# Patient Record
Sex: Female | Born: 1990
Health system: Southern US, Community
[De-identification: ages and names within clinical notes are randomized; demographics above are authoritative.]

## PROBLEM LIST (undated history)

## (undated) DIAGNOSIS — Z91148 Patient's other noncompliance with medication regimen for other reason: Secondary | ICD-10-CM

## (undated) DIAGNOSIS — M419 Scoliosis, unspecified: Secondary | ICD-10-CM

## (undated) DIAGNOSIS — G8929 Other chronic pain: Secondary | ICD-10-CM

## (undated) DIAGNOSIS — E119 Type 2 diabetes mellitus without complications: Secondary | ICD-10-CM

## (undated) DIAGNOSIS — M62838 Other muscle spasm: Secondary | ICD-10-CM

## (undated) DIAGNOSIS — B373 Candidiasis of vulva and vagina: Secondary | ICD-10-CM

## (undated) DIAGNOSIS — Z9114 Patient's other noncompliance with medication regimen: Secondary | ICD-10-CM

## (undated) DIAGNOSIS — B3731 Acute candidiasis of vulva and vagina: Secondary | ICD-10-CM

## (undated) DIAGNOSIS — M546 Pain in thoracic spine: Secondary | ICD-10-CM

## (undated) DIAGNOSIS — G629 Polyneuropathy, unspecified: Secondary | ICD-10-CM

## (undated) HISTORY — PX: OTHER SURGICAL HISTORY: SHX169

---

## 2013-05-26 ENCOUNTER — Encounter (HOSPITAL_BASED_OUTPATIENT_CLINIC_OR_DEPARTMENT_OTHER): Payer: Self-pay | Admitting: Emergency Medicine

## 2013-05-26 DIAGNOSIS — H53149 Visual discomfort, unspecified: Secondary | ICD-10-CM | POA: Insufficient documentation

## 2013-05-26 DIAGNOSIS — R51 Headache: Secondary | ICD-10-CM | POA: Insufficient documentation

## 2013-05-26 DIAGNOSIS — R110 Nausea: Secondary | ICD-10-CM | POA: Insufficient documentation

## 2013-05-26 DIAGNOSIS — R42 Dizziness and giddiness: Secondary | ICD-10-CM | POA: Insufficient documentation

## 2013-05-26 DIAGNOSIS — Z3202 Encounter for pregnancy test, result negative: Secondary | ICD-10-CM | POA: Insufficient documentation

## 2013-05-26 NOTE — ED Provider Notes (Signed)
CSN: [id number]     Arrival date & time 05/26/13  2140 History  This chart was scribed for [name], MD by Leone Payor, ED Scribe. This patient was seen in room MH09/MH09 and the patient's care was started 12:25 AM.    Chief Complaint  Patient presents with  . Headache    Patient is a 23 y.o. female presenting with headaches. The history is provided by the patient. No language interpreter was used.  Headache Pain location:  Frontal Quality:  Dull Radiates to:  Does not radiate Onset quality:  Gradual Timing:  Constant Progression:  Unchanged Chronicity:  Recurrent Similar to prior headaches: yes   Context: not activity, not exposure to bright light and not straining   Relieved by:  Nothing Worsened by:  Nothing tried Ineffective treatments:  None tried Associated symptoms: nausea   Associated symptoms: no abdominal pain, no diarrhea, no ear pain, no pain, no focal weakness, no neck pain, no paresthesias, no sinus pressure, no sore throat, no vomiting and no weakness   Risk factors: no anger    HPI Comments: Elaine Vasquez is a 23 y.o. female who presents to the Emergency Department complaining of a constant, gradually worsened left sided HA that began 9 hours ago. She also reports having associated nausea, dizziness, photophobia. She denies taking any OTC medications because she may be pregnant. She has not followed up with an OB about this. LNMP was Dec 5-8. She having a light period that started later than normal for this month.   History reviewed. No pertinent past medical history. History reviewed. No pertinent past surgical history. History reviewed. No pertinent family history. History  Substance Use Topics  . Smoking status: Never Smoker   . Smokeless tobacco: Never Used  . Alcohol Use: No   OB History   Grav Para Term Preterm Abortions TAB SAB Ect Mult Living   1 1             Review of Systems  HENT: Negative for ear pain, sinus pressure and sore  throat.   Eyes: Negative for pain.  Gastrointestinal: Positive for nausea. Negative for vomiting, abdominal pain and diarrhea.  Musculoskeletal: Negative for neck pain.  Neurological: Positive for headaches. Negative for focal weakness and paresthesias.  All other systems reviewed and are negative.    Allergies  Review of patient's allergies indicates no known allergies.  Home Medications  No current outpatient prescriptions on file. BP 122/63  Pulse 75  Temp(Src) 98.4 F (36.9 C) (Oral)  Resp 16  SpO2 100%  LMP 05/21/2013 Physical Exam  Nursing note and vitals reviewed. Constitutional: She is oriented to person, place, and time. She appears well-developed and well-nourished.  HENT:  Head: Normocephalic and atraumatic.  Mouth/Throat: Oropharynx is clear and moist.  Eyes: Conjunctivae and EOM are normal. Pupils are equal, round, and reactive to light.  Neck: Normal range of motion. Neck supple.  Cardiovascular: Normal rate, regular rhythm, normal heart sounds and intact distal pulses.   Pulmonary/Chest: Effort normal and breath sounds normal. No respiratory distress. She has no wheezes. She has no rales.  Abdominal: Soft. She exhibits no distension. There is no tenderness. There is no rebound and no guarding.  Musculoskeletal: Normal range of motion.  Neurological: She is alert and oriented to person, place, and time. She has normal reflexes. No cranial nerve deficit.  Skin: Skin is warm and dry.  Psychiatric: She has a normal mood and affect.    ED Course  Procedures (  including critical care time)  DIAGNOSTIC STUDIES: Oxygen Saturation is 100% on RA, normal by my interpretation.    COORDINATION OF CARE: 12:28 AM Discussed treatment plan with pt at bedside and pt agreed to plan.   Labs Review Labs Reviewed  PREGNANCY, URINE   Imaging Review No results found.  EKG Interpretation   None       MDM  No diagnosis found. Follow up with your family doctor for  ongoing care  I personally performed the services described in this documentation, which was scribed in my presence. The recorded information has been reviewed and is accurate.    Jasmine AweApril K Perley Arthurs-Rasch, MD 05/27/13 [phone]

## 2013-05-26 NOTE — ED Notes (Signed)
Pt reports headache onset at 15:00 today that gradually worsened to photophobeia and nausea, denies emesis. Denies taking OTC meds d/t fear of pregnancy

## 2013-05-27 ENCOUNTER — Emergency Department (HOSPITAL_BASED_OUTPATIENT_CLINIC_OR_DEPARTMENT_OTHER)
Admission: EM | Admit: 2013-05-27 | Discharge: 2013-05-27 | Disposition: A | Discharge status: Home / Self Care | Payer: Medicaid Other | Attending: Emergency Medicine | Admitting: Emergency Medicine

## 2013-05-27 DIAGNOSIS — R51 Headache: Secondary | ICD-10-CM

## 2013-05-27 DIAGNOSIS — R519 Headache, unspecified: Secondary | ICD-10-CM

## 2013-05-27 LAB — PREGNANCY, URINE: PREG TEST UR: NEGATIVE

## 2013-05-27 MED ORDER — ACETAMINOPHEN 500 MG PO TABS
1000.0000 mg | ORAL_TABLET | Freq: Once | ORAL | Status: AC
  Administered 2013-05-27: 1000 mg via ORAL
  Filled 2013-05-27: qty 2

## 2013-05-27 NOTE — ED Notes (Signed)
Pt given d/c instructions States headache is completely relieved

## 2014-03-18 ENCOUNTER — Encounter (HOSPITAL_BASED_OUTPATIENT_CLINIC_OR_DEPARTMENT_OTHER): Payer: Self-pay | Admitting: Emergency Medicine

## 2015-08-20 ENCOUNTER — Emergency Department (HOSPITAL_BASED_OUTPATIENT_CLINIC_OR_DEPARTMENT_OTHER)
Admission: EM | Admit: 2015-08-20 | Discharge: 2015-08-20 | Disposition: A | Discharge status: Home / Self Care | Payer: Medicaid Other | Attending: Emergency Medicine | Admitting: Emergency Medicine

## 2015-08-20 ENCOUNTER — Encounter (HOSPITAL_BASED_OUTPATIENT_CLINIC_OR_DEPARTMENT_OTHER): Payer: Self-pay

## 2015-08-20 DIAGNOSIS — J069 Acute upper respiratory infection, unspecified: Secondary | ICD-10-CM | POA: Diagnosis not present

## 2015-08-20 DIAGNOSIS — J302 Other seasonal allergic rhinitis: Secondary | ICD-10-CM | POA: Insufficient documentation

## 2015-08-20 DIAGNOSIS — Z8739 Personal history of other diseases of the musculoskeletal system and connective tissue: Secondary | ICD-10-CM | POA: Diagnosis not present

## 2015-08-20 DIAGNOSIS — E119 Type 2 diabetes mellitus without complications: Secondary | ICD-10-CM | POA: Diagnosis not present

## 2015-08-20 DIAGNOSIS — R05 Cough: Secondary | ICD-10-CM | POA: Diagnosis present

## 2015-08-20 DIAGNOSIS — Z7984 Long term (current) use of oral hypoglycemic drugs: Secondary | ICD-10-CM | POA: Diagnosis not present

## 2015-08-20 HISTORY — DX: Type 2 diabetes mellitus without complications: E11.9

## 2015-08-20 HISTORY — DX: Scoliosis, unspecified: M41.9

## 2015-08-20 HISTORY — DX: Other muscle spasm: M62.838

## 2015-08-20 LAB — CBG MONITORING, ED: GLUCOSE-CAPILLARY: 115 mg/dL — AB (ref 65–99)

## 2015-08-20 LAB — RAPID STREP SCREEN (MED CTR MEBANE ONLY): Streptococcus, Group A Screen (Direct): NEGATIVE

## 2015-08-20 MED ORDER — LORATADINE 10 MG PO TABS: 10.0000 mg | ORAL_TABLET | Freq: Every day | ORAL | Status: DC

## 2015-08-20 MED ORDER — FLUTICASONE PROPIONATE 50 MCG/ACT NA SUSP: 2.0000 | Freq: Every day | NASAL | Status: DC

## 2015-08-20 MED ORDER — IBUPROFEN 600 MG PO TABS: 600.0000 mg | ORAL_TABLET | Freq: Four times a day (QID) | ORAL | Status: DC | PRN

## 2015-08-20 NOTE — ED Notes (Signed)
Pt c/o sneezing, coughing, running nose, headache and body aches since 1p yesterday

## 2015-08-20 NOTE — Discharge Instructions (Signed)
Allergic Rhinitis Allergic rhinitis is when the mucous membranes in the nose respond to allergens. Allergens are particles in the air that cause your body to have an allergic reaction. This causes you to release allergic antibodies. Through a chain of events, these eventually cause you to release histamine into the blood stream. Although meant to protect the body, it is this release of histamine that causes your discomfort, such as frequent sneezing, congestion, and an itchy, runny nose.  CAUSES Seasonal allergic rhinitis (hay fever) is caused by pollen allergens that may come from grasses, trees, and weeds. Year-round allergic rhinitis (perennial allergic rhinitis) is caused by allergens such as house dust mites, pet dander, and mold spores. SYMPTOMS  Nasal stuffiness (congestion).  Itchy, runny nose with sneezing and tearing of the eyes. DIAGNOSIS Your health care provider can help you determine the allergen or allergens that trigger your symptoms. If you and your health care provider are unable to determine the allergen, skin or blood testing may be used. Your health care provider will diagnose your condition after taking your health history and performing a physical exam. Your health care provider may assess you for other related conditions, such as asthma, pink eye, or an ear infection. TREATMENT Allergic rhinitis does not have a cure, but it can be controlled by:  Medicines that block allergy symptoms. These may include allergy shots, nasal sprays, and oral antihistamines.  Avoiding the allergen. Hay fever may often be treated with antihistamines in pill or nasal spray forms. Antihistamines block the effects of histamine. There are over-the-counter medicines that may help with nasal congestion and swelling around the eyes. Check with your health care provider before taking or giving this medicine. If avoiding the allergen or the medicine prescribed do not work, there are many new medicines  your health care provider can prescribe. Stronger medicine may be used if initial measures are ineffective. Desensitizing injections can be used if medicine and avoidance does not work. Desensitization is when a patient is given ongoing shots until the body becomes less sensitive to the allergen. Make sure you follow up with your health care provider if problems continue. HOME CARE INSTRUCTIONS It is not possible to completely avoid allergens, but you can reduce your symptoms by taking steps to limit your exposure to them. It helps to know exactly what you are allergic to so that you can avoid your specific triggers. SEEK MEDICAL CARE IF:  You have a fever.  You develop a cough that does not stop easily (persistent).  You have shortness of breath.  You start wheezing.  Symptoms interfere with normal daily activities.   This information is not intended to replace advice given to you by your health care provider. Make sure you discuss any questions you have with your health care provider.   Document Released: 01/26/2001 Document Revised: 05/24/2014 Document Reviewed: 01/08/2013 Elsevier Interactive Patient Education 2016 [employer]  

## 2015-08-20 NOTE — ED Provider Notes (Signed)
CSN: [id number]     Arrival date & time 08/20/15  0127 History   First MD Initiated Contact with Patient 08/20/15 0154     Chief Complaint  Patient presents with  . URI     (Consider location/radiation/quality/duration/timing/severity/associated sxs/prior Treatment) HPI  This is a 25 year old female with a history of diabetes who presents with upper respiratory symptoms. Onset of symptoms this afternoon. Patient reports sneezing, coughing, rhinorrhea, and myalgias. She also reports watery eyes. She has not taken anything for her symptoms. No known fevers but does report chills. Reports a sick contact at work. Denies any vomiting or diarrhea. Denies any abdominal pain. Patient is a diabetic and is supposed to be on insulin. Patient reports that she ran out of her insulin and her testing supplies. She is unsure of her recent blood sugars.  Past Medical History  Diagnosis Date  . Diabetes mellitus without complication (HCC)   . Scoliosis   . Muscle spasm    History reviewed. No pertinent past surgical history. No family history on file. Social History  Substance Use Topics  . Smoking status: Never Smoker   . Smokeless tobacco: Never Used  . Alcohol Use: Yes     Comment: social   OB History    Gravida Para Term Preterm AB TAB SAB Ectopic Multiple Living   1 1             Review of Systems  Constitutional: Negative for fever.  HENT: Positive for rhinorrhea, sneezing and sore throat.   Respiratory: Positive for cough. Negative for shortness of breath.   Cardiovascular: Negative for chest pain.  Gastrointestinal: Negative for nausea, vomiting, abdominal pain and diarrhea.  All other systems reviewed and are negative.     Allergies  Review of patient's allergies indicates no known allergies.  Home Medications   Prior to Admission medications   Medication Sig Start Date End Date Taking? Authorizing Provider  metFORMIN (GLUCOPHAGE) 1000 MG tablet Take 1,000 mg by mouth 2  (two) times daily with a meal.   Yes Historical Provider, MD  fluticasone (FLONASE) 50 MCG/ACT nasal spray Place 2 sprays into both nostrils daily. 08/20/15   Shon Batonourtney F Horton, MD  ibuprofen (ADVIL,MOTRIN) 600 MG tablet Take 1 tablet (600 mg total) by mouth every 6 (six) hours as needed. 08/20/15   Shon Batonourtney F Horton, MD  loratadine (CLARITIN) 10 MG tablet Take 1 tablet (10 mg total) by mouth daily. 08/20/15   Shon Batonourtney F Horton, MD   BP 120/65 mmHg  Pulse 94  Temp(Src) 99.1 F (37.3 C) (Oral)  Resp 16  Ht 5\' 3"  (1.6 m)  Wt 150 lb (68.04 kg)  BMI 26.58 kg/m2  SpO2 100%  LMP 08/06/2015 Physical Exam  Constitutional: She is oriented to person, place, and time. She appears well-developed and well-nourished.  HENT:  Head: Normocephalic and atraumatic.  Erythema to the posterior oropharynx, postnasal drip noted, mild tonsillar swelling, no exudate Effusions without redness or drilling of the light reflex behind bilateral TMs.  Eyes: Pupils are equal, round, and reactive to light.  Tearing noted with mild conjunctival injection  Cardiovascular: Normal rate, regular rhythm and normal heart sounds.   No murmur heard. Pulmonary/Chest: Effort normal and breath sounds normal. No respiratory distress. She has no wheezes.  Abdominal: Soft. Bowel sounds are normal. There is no tenderness. There is no rebound.  Neurological: She is alert and oriented to person, place, and time.  Skin: Skin is warm and dry.  Psychiatric: She has a  normal mood and affect.  Nursing note and vitals reviewed.   ED Course  Procedures (including critical care time) Labs Review Labs Reviewed  CBG MONITORING, ED - Abnormal; Notable for the following:    Glucose-Capillary 115 (*)    All other components within normal limits  RAPID STREP SCREEN (NOT AT [facility])  CULTURE, GROUP A STREP [facility])    Imaging Review No results found. I have personally reviewed and evaluated these images and lab results as part of my medical  decision-making.   EKG Interpretation None      MDM   Final diagnoses:  Seasonal allergies  URI (upper respiratory infection)    Patient presents with upper respiratory symptoms. Given sneezing and watery eyes, allergies are consideration. However, patient may also have an early viral illness. She is nontoxic-appearing and afebrile. Blood sugar 115. Screening strep is negative.  Discussed with patient supportive care at home including Flonase, Claritin, and ibuprofen or Tylenol for body aches.  After history, exam, and medical workup I feel the patient has been appropriately medically screened and is safe for discharge home. Pertinent diagnoses were discussed with the patient. Patient was given return precautions.     Shon Baton, MD 08/20/15 [phone]

## 2015-08-22 LAB — CULTURE, GROUP A STREP (THRC)

## 2016-01-27 ENCOUNTER — Emergency Department (HOSPITAL_BASED_OUTPATIENT_CLINIC_OR_DEPARTMENT_OTHER)
Admission: EM | Admit: 2016-01-27 | Discharge: 2016-01-28 | Disposition: A | Discharge status: Home / Self Care | Payer: Medicaid Other | Attending: Emergency Medicine | Admitting: Emergency Medicine

## 2016-01-27 ENCOUNTER — Encounter (HOSPITAL_BASED_OUTPATIENT_CLINIC_OR_DEPARTMENT_OTHER): Payer: Self-pay | Admitting: Emergency Medicine

## 2016-01-27 DIAGNOSIS — E1165 Type 2 diabetes mellitus with hyperglycemia: Secondary | ICD-10-CM | POA: Insufficient documentation

## 2016-01-27 DIAGNOSIS — R358 Other polyuria: Secondary | ICD-10-CM | POA: Diagnosis not present

## 2016-01-27 DIAGNOSIS — R109 Unspecified abdominal pain: Secondary | ICD-10-CM | POA: Insufficient documentation

## 2016-01-27 DIAGNOSIS — R632 Polyphagia: Secondary | ICD-10-CM | POA: Diagnosis not present

## 2016-01-27 DIAGNOSIS — R739 Hyperglycemia, unspecified: Secondary | ICD-10-CM

## 2016-01-27 LAB — PREGNANCY, URINE: PREG TEST UR: NEGATIVE

## 2016-01-27 LAB — CBC WITH DIFFERENTIAL/PLATELET
BASOS PCT: 0 %
Basophils Absolute: 0 10*3/uL (ref 0.0–0.1)
EOS ABS: 0.1 10*3/uL (ref 0.0–0.7)
EOS PCT: 2 %
HCT: 39.7 % (ref 36.0–46.0)
Hemoglobin: 13.9 g/dL (ref 12.0–15.0)
Lymphocytes Relative: 39 %
Lymphs Abs: 2.9 10*3/uL (ref 0.7–4.0)
MCH: 30.4 pg (ref 26.0–34.0)
MCHC: 35 g/dL (ref 30.0–36.0)
MCV: 86.9 fL (ref 78.0–100.0)
MONO ABS: 0.6 10*3/uL (ref 0.1–1.0)
MONOS PCT: 8 %
NEUTROS ABS: 3.9 10*3/uL (ref 1.7–7.7)
Neutrophils Relative %: 51 %
Platelets: 236 10*3/uL (ref 150–400)
RBC: 4.57 MIL/uL (ref 3.87–5.11)
RDW: 12.3 % (ref 11.5–15.5)
WBC: 7.5 10*3/uL (ref 4.0–10.5)

## 2016-01-27 LAB — URINALYSIS, ROUTINE W REFLEX MICROSCOPIC
BILIRUBIN URINE: NEGATIVE
Glucose, UA: >1000 mg/dL — AB
HGB URINE DIPSTICK: NEGATIVE
KETONES UR: NEGATIVE mg/dL
LEUKOCYTES UA: NEGATIVE
Nitrite: NEGATIVE
PH: 6 (ref 5.0–8.0)
PROTEIN: NEGATIVE mg/dL
Specific Gravity, Urine: 1.041 — ABNORMAL HIGH (ref 1.005–1.030)

## 2016-01-27 LAB — URINE MICROSCOPIC-ADD ON
RBC / HPF: NONE SEEN RBC/hpf (ref 0–5)
WBC, UA: NONE SEEN WBC/hpf (ref 0–5)

## 2016-01-27 LAB — I-STAT VENOUS BLOOD GAS, ED
ACID-BASE EXCESS: 1 mmol/L (ref 0.0–2.0)
BICARBONATE: 27.5 mmol/L (ref 20.0–28.0)
O2 SAT: 30 %
PO2 VEN: 20 mmHg — AB (ref 32.0–45.0)
TCO2: 29 mmol/L (ref 0–100)
pCO2, Ven: 48.4 mmHg (ref 44.0–60.0)
pH, Ven: 7.362 (ref 7.250–7.430)

## 2016-01-27 LAB — CBG MONITORING, ED

## 2016-01-27 MED ORDER — SODIUM CHLORIDE 0.9 % IV BOLUS (SEPSIS)
1000.0000 mL | Freq: Once | INTRAVENOUS | Status: AC
  Administered 2016-01-28: 1000 mL via INTRAVENOUS

## 2016-01-27 MED ORDER — SODIUM CHLORIDE 0.9 % IV BOLUS (SEPSIS)
1000.0000 mL | Freq: Once | INTRAVENOUS | Status: AC
  Administered 2016-01-27: 1000 mL via INTRAVENOUS

## 2016-01-27 NOTE — ED Provider Notes (Signed)
[facility]   CSN: [id number] Arrival date & time: 01/27/16  2244   By signing my name below, I, Elaine Vasquez, attest that this documentation has been prepared under the direction and in the presence of non-physician practitioner, Jaynie Crumbleatyana Ty Buntrock, PA-C. Electronically Signed: Nelwyn SalisburyJoshua Vasquez, Scribe. 01/27/2016. 11:21 PM.  History   Chief Complaint Chief Complaint  Patient presents with  . Hyperglycemia   The history is provided by the patient. No language interpreter was used.    HPI Comments:  Elaine Vasquez is a 25 y.o. female with PMHx of Diabetes mellitus who presents to the Emergency Department complaining elevated blood sugar today. Pt states that when she took her blood sugar today, it was greater than 600, which brought her to the ED. Pt reports that she has been out of her metformin for for the past 2 days, and is not able to afford her insulin, although she adds that she has been taken off of her insulin multiple months ago, because her sugars were well controlled.  Pt states she let her medicaid lapse so at this time unable to see PCP to have her medications be refilled. Pt reports blood sugars in 300s over last several days. She endorses associated blurred vision, abdominal discomfort, changes in appetite, headaches, and polyuria and polydipsia. No treatment prior to coming in.   Past Medical History:  Diagnosis Date  . Diabetes mellitus without complication (HCC)   . Muscle spasm   . Scoliosis     There are no active problems to display for this patient.   History reviewed. No pertinent surgical history.  OB History    Gravida Para Term Preterm AB Living   1 1           SAB TAB Ectopic Multiple Live Births                   Home Medications    Prior to Admission medications   Medication Sig Start Date End Date Taking? Authorizing Provider  fluticasone (FLONASE) 50 MCG/ACT nasal spray Place 2 sprays into both nostrils daily. 08/20/15    Shon Batonourtney F Horton, MD  ibuprofen (ADVIL,MOTRIN) 600 MG tablet Take 1 tablet (600 mg total) by mouth every 6 (six) hours as needed. 08/20/15   Shon Batonourtney F Horton, MD  loratadine (CLARITIN) 10 MG tablet Take 1 tablet (10 mg total) by mouth daily. 08/20/15   Shon Batonourtney F Horton, MD  metFORMIN (GLUCOPHAGE) 500 MG tablet Take 2 tablets (1,000 mg total) by mouth 2 (two) times daily with a meal. 01/28/16   Jaynie Crumbleatyana Sharnice Bosler, PA-C    Family History History reviewed. No pertinent family history.  Social History Social History  Substance Use Topics  . Smoking status: Never Smoker  . Smokeless tobacco: Never Used  . Alcohol use Yes     Comment: social     Allergies   Review of patient's allergies indicates no known allergies.   Review of Systems Review of Systems  Constitutional: Positive for appetite change.       Positive for Hyperglycemia  Eyes: Positive for visual disturbance.  Respiratory: Negative for shortness of breath.   Gastrointestinal: Positive for abdominal pain. Negative for diarrhea, nausea and vomiting.  Endocrine: Positive for polyphagia and polyuria.  Genitourinary: Positive for frequency (Bladder).  Neurological: Positive for headaches.  All other systems reviewed and are negative.    Physical Exam Updated Vital Signs BP 118/65   Pulse 77   Temp 98.6 F (37 C) (Oral)  Resp 16   Ht 5\' 3"  (1.6 m)   LMP 12/31/2015   SpO2 98%   Physical Exam  Constitutional: She appears well-developed and well-nourished. No distress.  HENT:  Head: Normocephalic.  Eyes: Conjunctivae are normal.  Neck: Neck supple.  Cardiovascular: Normal rate, regular rhythm and normal heart sounds.   Pulmonary/Chest: Effort normal and breath sounds normal. No respiratory distress. She has no wheezes. She has no rales.  Abdominal: Soft. Bowel sounds are normal. She exhibits no distension. There is no tenderness. There is no rebound.  Musculoskeletal: She exhibits no edema.  Neurological: She  is alert.  Skin: Skin is warm and dry.  Psychiatric: She has a normal mood and affect. Her behavior is normal.  Nursing note and vitals reviewed.    ED Treatments / Results  DIAGNOSTIC STUDIES:  Oxygen Saturation is 100% on Ra, normal by my interpretation.    COORDINATION OF CARE:  11:36 PM Discussed treatment plan with pt at bedside which included metformin, fluids, and diet/lifestyle changes and pt agreed to plan.  Labs (all labs ordered are listed, but only abnormal results are displayed) Labs Reviewed  URINALYSIS, ROUTINE W REFLEX MICROSCOPIC (NOT AT [facility]) - Abnormal; Notable for the following:       Result Value   Specific Gravity, Urine 1.041 (*)    Glucose, UA >1000 (*)    All other components within normal limits  COMPREHENSIVE METABOLIC PANEL - Abnormal; Notable for the following:    Sodium 127 (*)    Chloride 93 (*)    Glucose, Bld 702 (*)    Calcium 8.8 (*)    AST 11 (*)    ALT 10 (*)    All other components within normal limits  URINE MICROSCOPIC-ADD ON - Abnormal; Notable for the following:    Squamous Epithelial / LPF 0-5 (*)    Bacteria, UA RARE (*)    All other components within normal limits  CBG MONITORING, ED - Abnormal; Notable for the following:    Glucose-Capillary >600 (*)    All other components within normal limits  I-STAT VENOUS BLOOD GAS, ED - Abnormal; Notable for the following:    pO2, Ven 20.0 (*)    All other components within normal limits  CBG MONITORING, ED - Abnormal; Notable for the following:    Glucose-Capillary 365 (*)    All other components within normal limits  PREGNANCY, URINE  CBC WITH DIFFERENTIAL/PLATELET  CBG MONITORING, ED    EKG  EKG Interpretation None       Radiology No results found.  Procedures Procedures (including critical care time)  Medications Ordered in ED Medications  sodium chloride 0.9 % bolus 1,000 mL (0 mLs Intravenous Stopped 01/28/16 0115)  sodium chloride 0.9 % bolus 1,000 mL (0 mLs  Intravenous Stopped 01/28/16 0014)  acetaminophen (TYLENOL) tablet 650 mg (650 mg Oral Given 01/28/16 0038)  insulin regular (NOVOLIN R,HUMULIN R) 100 units/mL injection 12 Units (12 Units Subcutaneous Given 01/28/16 0040)     Initial Impression / Assessment and Plan / ED Course  I have reviewed the triage vital signs and the nursing notes.  Pertinent labs & imaging results that were available during my care of the patient were reviewed by me and considered in my medical decision making (see chart for details).  Clinical Course  Value Comment By Time  Glucose-Capillary: (!!) >600 (Reviewed) Elaine Salisbury 09/12 2337    Patient seen and examined upon presentation. Patient with polydipsia, polyuria, some blurred vision, generalized malaise,  blood sugar today read over 600. Patient has been off of metformin for several days, it is unclear how long, because initially she said it was few days but later added she hasn't been able to see her doctor in several months. Patient is not in any distress. Her vital signs are normal. Will check labs including urinalysis, metabolic panel, CBC, will give IV fluids and insulin.   Patient an anion gap is 7. No ketones in urine. VBG unremarkable. No evidence of DKA. Patient is being hydrated, will recheck her CBG after insulin and 3 L of normal saline given. Plan to discharge home if able to lower her blood sugar and continues to well. Return precautions strictly discussed. Given prescription for metformin to take at home.  Pt signed out to Dr. Ranae Palms at shift change pending cbg recheck.   Final Clinical Impressions(s) / ED Diagnoses   Final diagnoses:  Hyperglycemia    New Prescriptions Discharge Medication List as of 01/28/2016 12:22 AM     I personally performed the services described in this documentation, which was scribed in my presence. The recorded information has been reviewed and is accurate.     Jaynie Crumble, PA-C 01/28/16 1555      Loren Racer, MD 01/29/16 [phone]

## 2016-01-27 NOTE — ED Notes (Signed)
PA at bedside.

## 2016-01-27 NOTE — ED Notes (Signed)
Blood sugar greater than 600 in triage

## 2016-01-27 NOTE — ED Notes (Signed)
PA student at bedside.

## 2016-01-27 NOTE — ED Triage Notes (Signed)
Patient reports that "i don't feel good" - patient reports that she has a a elevated Blood sugar and she has a Headache. The patient reports that she has been out of her metformin since Sunday

## 2016-01-28 LAB — COMPREHENSIVE METABOLIC PANEL
ALT: 10 U/L — ABNORMAL LOW (ref 14–54)
ANION GAP: 7 (ref 5–15)
AST: 11 U/L — ABNORMAL LOW (ref 15–41)
Albumin: 4.2 g/dL (ref 3.5–5.0)
Alkaline Phosphatase: 66 U/L (ref 38–126)
BILIRUBIN TOTAL: 0.5 mg/dL (ref 0.3–1.2)
BUN: 12 mg/dL (ref 6–20)
CO2: 27 mmol/L (ref 22–32)
Calcium: 8.8 mg/dL — ABNORMAL LOW (ref 8.9–10.3)
Chloride: 93 mmol/L — ABNORMAL LOW (ref 101–111)
Creatinine, Ser: 0.91 mg/dL (ref 0.44–1.00)
GFR calc Af Amer: >60 mL/min (ref >60)
GFR calc non Af Amer: >60 mL/min (ref >60)
Glucose, Bld: 702 mg/dL (ref 65–99)
POTASSIUM: 3.9 mmol/L (ref 3.5–5.1)
SODIUM: 127 mmol/L — AB (ref 135–145)
TOTAL PROTEIN: 7.3 g/dL (ref 6.5–8.1)

## 2016-01-28 LAB — CBG MONITORING, ED: Glucose-Capillary: 365 mg/dL — ABNORMAL HIGH (ref 65–99)

## 2016-01-28 MED ORDER — METFORMIN HCL 500 MG PO TABS: 1000.0000 mg | ORAL_TABLET | Freq: Two times a day (BID) | ORAL | 1 refills | Status: DC

## 2016-01-28 MED ORDER — INSULIN REGULAR HUMAN 100 UNIT/ML IJ SOLN
12.0000 [IU] | Freq: Once | INTRAMUSCULAR | Status: AC
  Administered 2016-01-28: 12 [IU] via SUBCUTANEOUS
  Filled 2016-01-28: qty 1

## 2016-01-28 MED ORDER — ACETAMINOPHEN 325 MG PO TABS
650.0000 mg | ORAL_TABLET | Freq: Once | ORAL | Status: AC
  Administered 2016-01-28: 650 mg via ORAL
  Filled 2016-01-28: qty 2

## 2016-01-28 NOTE — ED Notes (Signed)
 NS bolus given during computer downtime.  See downtime documentation. Infusion complete as of 0200

## 2016-01-28 NOTE — Discharge Instructions (Signed)
Continue to take metformin. Follow up closely with your doctor

## 2016-09-20 ENCOUNTER — Encounter (HOSPITAL_BASED_OUTPATIENT_CLINIC_OR_DEPARTMENT_OTHER): Payer: Self-pay | Admitting: *Deleted

## 2016-09-20 ENCOUNTER — Emergency Department (HOSPITAL_BASED_OUTPATIENT_CLINIC_OR_DEPARTMENT_OTHER)
Admission: EM | Admit: 2016-09-20 | Discharge: 2016-09-21 | Disposition: A | Discharge status: Home / Self Care | Payer: Medicaid Other | Attending: Emergency Medicine | Admitting: Emergency Medicine

## 2016-09-20 DIAGNOSIS — E1165 Type 2 diabetes mellitus with hyperglycemia: Secondary | ICD-10-CM | POA: Diagnosis not present

## 2016-09-20 DIAGNOSIS — B3731 Acute candidiasis of vulva and vagina: Secondary | ICD-10-CM

## 2016-09-20 DIAGNOSIS — N898 Other specified noninflammatory disorders of vagina: Secondary | ICD-10-CM | POA: Diagnosis present

## 2016-09-20 DIAGNOSIS — N760 Acute vaginitis: Secondary | ICD-10-CM

## 2016-09-20 DIAGNOSIS — Z794 Long term (current) use of insulin: Secondary | ICD-10-CM | POA: Diagnosis not present

## 2016-09-20 DIAGNOSIS — B373 Candidiasis of vulva and vagina: Secondary | ICD-10-CM | POA: Insufficient documentation

## 2016-09-20 DIAGNOSIS — R739 Hyperglycemia, unspecified: Secondary | ICD-10-CM

## 2016-09-20 HISTORY — DX: Candidiasis of vulva and vagina: B37.3

## 2016-09-20 HISTORY — DX: Patient's other noncompliance with medication regimen for other reason: Z91.148

## 2016-09-20 HISTORY — DX: Patient's other noncompliance with medication regimen: Z91.14

## 2016-09-20 HISTORY — DX: Other chronic pain: G89.29

## 2016-09-20 HISTORY — DX: Acute candidiasis of vulva and vagina: B37.31

## 2016-09-20 HISTORY — DX: Polyneuropathy, unspecified: G62.9

## 2016-09-20 HISTORY — DX: Pain in thoracic spine: M54.6

## 2016-09-20 LAB — CBC WITH DIFFERENTIAL/PLATELET
BASOS ABS: 0 10*3/uL (ref 0.0–0.1)
BASOS PCT: 0 %
EOS PCT: 4 %
Eosinophils Absolute: 0.4 10*3/uL (ref 0.0–0.7)
HCT: 37.9 % (ref 36.0–46.0)
Hemoglobin: 13.4 g/dL (ref 12.0–15.0)
LYMPHS PCT: 35 %
Lymphs Abs: 3.2 10*3/uL (ref 0.7–4.0)
MCH: 30.2 pg (ref 26.0–34.0)
MCHC: 35.4 g/dL (ref 30.0–36.0)
MCV: 85.6 fL (ref 78.0–100.0)
MONO ABS: 0.7 10*3/uL (ref 0.1–1.0)
Monocytes Relative: 8 %
NEUTROS ABS: 4.9 10*3/uL (ref 1.7–7.7)
Neutrophils Relative %: 53 %
PLATELETS: 305 10*3/uL (ref 150–400)
RBC: 4.43 MIL/uL (ref 3.87–5.11)
RDW: 12.3 % (ref 11.5–15.5)
WBC: 9.2 10*3/uL (ref 4.0–10.5)

## 2016-09-20 LAB — URINALYSIS, ROUTINE W REFLEX MICROSCOPIC
Bilirubin Urine: NEGATIVE
Glucose, UA: >=500 mg/dL — AB
Hgb urine dipstick: NEGATIVE
Ketones, ur: NEGATIVE mg/dL
Leukocytes, UA: NEGATIVE
NITRITE: NEGATIVE
Protein, ur: NEGATIVE mg/dL
Specific Gravity, Urine: 1.045 — ABNORMAL HIGH (ref 1.005–1.030)
pH: 6 (ref 5.0–8.0)

## 2016-09-20 LAB — WET PREP, GENITAL
CLUE CELLS WET PREP: NONE SEEN
Sperm: NONE SEEN
TRICH WET PREP: NONE SEEN

## 2016-09-20 LAB — URINALYSIS, MICROSCOPIC (REFLEX)

## 2016-09-20 LAB — PREGNANCY, URINE: PREG TEST UR: NEGATIVE

## 2016-09-20 LAB — CBG MONITORING, ED: GLUCOSE-CAPILLARY: 332 mg/dL — AB (ref 65–99)

## 2016-09-20 MED ORDER — CEFTRIAXONE SODIUM 250 MG IJ SOLR
250.0000 mg | Freq: Once | INTRAMUSCULAR | Status: AC
  Administered 2016-09-21: 250 mg via INTRAMUSCULAR
  Filled 2016-09-20: qty 250

## 2016-09-20 MED ORDER — FLUCONAZOLE 50 MG PO TABS
150.0000 mg | ORAL_TABLET | Freq: Once | ORAL | Status: AC
  Administered 2016-09-21: 150 mg via ORAL
  Filled 2016-09-20: qty 1

## 2016-09-20 MED ORDER — AZITHROMYCIN 250 MG PO TABS
1000.0000 mg | ORAL_TABLET | Freq: Once | ORAL | Status: AC
  Administered 2016-09-21: 1000 mg via ORAL
  Filled 2016-09-20: qty 4

## 2016-09-20 MED ORDER — SODIUM CHLORIDE 0.9 % IV BOLUS (SEPSIS)
1000.0000 mL | Freq: Once | INTRAVENOUS | Status: AC
  Administered 2016-09-20: 1000 mL via INTRAVENOUS

## 2016-09-20 NOTE — ED Provider Notes (Signed)
[facility] Provider Note   CSN: [id number] Arrival date & time: 09/20/16  2242  By signing my nme below, I, Elaine Vasquez, attest that this documentation has been prepared under the direction and in the presence of Pricilla LovelessGoldston, Melony Tenpas, MD. Electronically Signed: Rosana Fretana Vasquez, ED Scribe. 09/20/16. 11:21 PM.  History   Chief Complaint Chief Complaint  Patient presents with  . Vaginal Discharge  . Hyperglycemia    The history is provided by the patient. No language interpreter was used.   HPI Comments:  Elaine Vasquez is a 26 y.o. female with a PMHx of DM who presents to the Emergency Department complaining of constant moderate vaginal discharge onset 3 days ago. She went to the health department and was called and told she tested positive for trich. Pt states her blood sugar is high today and the past week, has been 400-500.She has been taking her medicines inconsitently. Pt denies vomiting, abdominal pain, vaginal bleeding, or vaginal pain.    Past Medical History:  Diagnosis Date  . Chronic midline thoracic back pain   . Diabetes mellitus without complication (HCC)   . Muscle spasm   . Noncompliance with medications   . Polyneuropathy   . Scoliosis   . Vaginal candida     There are no active problems to display for this patient.   History reviewed. No pertinent surgical history.  OB History    Gravida Para Term Preterm AB Living   1 1           SAB TAB Ectopic Multiple Live Births                   Home Medications    Prior to Admission medications   Medication Sig Start Date End Date Taking? Authorizing Provider  ibuprofen (ADVIL,MOTRIN) 600 MG tablet Take 1 tablet (600 mg total) by mouth every 6 (six) hours as needed. 08/20/15  Yes Horton, Mayer Maskerourtney F, MD  insulin aspart (NOVOLOG) 100 unit/mL injection Inject 10 Units into the skin 3 (three) times daily before meals.   Yes [provider]  insulin glargine (LANTUS) 100 UNIT/ML injection  Inject into the skin at bedtime.   Yes [provider]  fluconazole (DIFLUCAN) 150 MG tablet Take 1 tablet (150 mg total) by mouth once. 09/23/16 09/23/16  Pricilla LovelessGoldston, Tashya Alberty, MD  fluticasone (FLONASE) 50 MCG/ACT nasal spray Place 2 sprays into both nostrils daily. 08/20/15   Horton, Mayer Maskerourtney F, MD  loratadine (CLARITIN) 10 MG tablet Take 1 tablet (10 mg total) by mouth daily. 08/20/15   Horton, Mayer Maskerourtney F, MD  metFORMIN (GLUCOPHAGE) 500 MG tablet Take 2 tablets (1,000 mg total) by mouth 2 (two) times daily with a meal. 01/28/16   Jaynie CrumbleKirichenko, Tatyana, PA-C    Family History No family history on file.  Social History Social History  Substance Use Topics  . Smoking status: Never Smoker  . Smokeless tobacco: Never Used  . Alcohol use Yes     Comment: social     Allergies   Patient has no known allergies.   Review of Systems Review of Systems  Gastrointestinal: Negative for abdominal pain and vomiting.  Genitourinary: Positive for vaginal discharge. Negative for vaginal bleeding and vaginal pain.  All other systems reviewed and are negative.    Physical Exam Updated Vital Signs BP 120/75 (BP Location: Right Arm)   Pulse 72   Temp 98.6 F (37 C) (Oral)   Resp 18   Ht 5\' 3"  (1.6 m)   Wt  151 lb (68.5 kg)   LMP 08/24/2016   SpO2 100%   BMI 26.75 kg/m   Physical Exam  Constitutional: She is oriented to person, place, and time. She appears well-developed and well-nourished.  HENT:  Head: Normocephalic and atraumatic.  Right Ear: External ear normal.  Left Ear: External ear normal.  Nose: Nose normal.  Mouth/Throat: Oropharynx is clear and moist.  Eyes: Right eye exhibits no discharge. Left eye exhibits no discharge.  Cardiovascular: Normal rate, regular rhythm and normal heart sounds.   Pulmonary/Chest: Effort normal and breath sounds normal.  Abdominal: Soft. There is no tenderness.  Genitourinary: Uterus is not enlarged and not tender. Cervix exhibits discharge.  Cervix exhibits no motion tenderness. Right adnexum displays no mass and no tenderness. Left adnexum displays no mass and no tenderness. Vaginal discharge found.  Neurological: She is alert and oriented to person, place, and time.  Skin: Skin is warm and dry.  Nursing note and vitals reviewed.    ED Treatments / Results  DIAGNOSTIC STUDIES: Oxygen Saturation is 100% on RA, normal by my interpretation.   COORDINATION OF CARE: 11:19 PM-Discussed next steps with pt. Pt verbalized understanding and is agreeable with the plan.  Labs (all labs ordered are listed, but only abnormal results are displayed) Labs Reviewed  WET PREP, GENITAL - Abnormal; Notable for the following:       Result Value   Yeast Wet Prep HPF POC PRESENT (*)    WBC, Wet Prep HPF POC MANY (*)    All other components within normal limits  URINALYSIS, ROUTINE W REFLEX MICROSCOPIC - Abnormal; Notable for the following:    Specific Gravity, Urine 1.045 (*)    Glucose, UA >=500 (*)    All other components within normal limits  URINALYSIS, MICROSCOPIC (REFLEX) - Abnormal; Notable for the following:    Bacteria, UA RARE (*)    Squamous Epithelial / LPF 0-5 (*)    All other components within normal limits  BASIC METABOLIC PANEL - Abnormal; Notable for the following:    Sodium 134 (*)    Potassium 3.4 (*)    Chloride 99 (*)    Glucose, Bld 314 (*)    All other components within normal limits  CBG MONITORING, ED - Abnormal; Notable for the following:    Glucose-Capillary 332 (*)    All other components within normal limits  CBG MONITORING, ED - Abnormal; Notable for the following:    Glucose-Capillary 283 (*)    All other components within normal limits  PREGNANCY, URINE  CBC WITH DIFFERENTIAL/PLATELET  GC/CHLAMYDIA PROBE AMP ([place]) NOT AT [facility]    EKG  EKG Interpretation None       Radiology No results found.  Procedures Procedures (including critical care time)  Medications Ordered in  ED Medications  sodium chloride 0.9 % bolus 1,000 mL (0 mLs Intravenous Stopped 09/21/16 0037)  cefTRIAXone (ROCEPHIN) injection 250 mg (250 mg Intramuscular Given 09/21/16 0011)  azithromycin (ZITHROMAX) tablet 1,000 mg (1,000 mg Oral Given 09/21/16 0009)  fluconazole (DIFLUCAN) tablet 150 mg (150 mg Oral Given 09/21/16 0010)  ondansetron (ZOFRAN-ODT) disintegrating tablet 4 mg (4 mg Oral Given 09/21/16 0038)  metroNIDAZOLE (FLAGYL) tablet 2,000 mg (2,000 mg Oral Given 09/21/16 0038)  potassium chloride SA (K-DUR,KLOR-CON) CR tablet 40 mEq (40 mEq Oral Given 09/21/16 0052)     Initial Impression / Assessment and Plan / ED Course  I have reviewed the triage vital signs and the nursing notes.  Pertinent labs & imaging  results that were available during my care of the patient were reviewed by me and considered in my medical decision making (see chart for details).     Workup shows hyperglycemia without acidosis. Given fluids and oral potassium. Her wet prep does not show trichomonas. However given that she was called with a positive result, will still treat with oral Flagyl. She would like to be treated prophylactically for gonorrhea and chlamydia given concern for at least 1 STI. No signs of PID. Recommend she take her insulin more consistently involved with her PCP. Will treat yeast with Diflucan now and in 3 days given she always gets increased yeast infections from antibiotics.  Final Clinical Impressions(s) / ED Diagnoses   Final diagnoses:  Hyperglycemia  Acute vaginitis  Yeast vaginitis    New Prescriptions Discharge Medication List as of 09/21/2016  1:00 AM    START taking these medications   Details  fluconazole (DIFLUCAN) 150 MG tablet Take 1 tablet (150 mg total) by mouth once., Starting Thu 09/23/2016, Print       I personally performed the services described in this documentation, which was scribed in my presence. The recorded information has been reviewed and is accurate.      Pricilla Loveless, MD 09/21/16 0111

## 2016-09-20 NOTE — ED Triage Notes (Signed)
Vaginal discharge x 3 days. States she needs to be treated for STD exposure. Her blood sugar is also high. She did not take her Insulin today.

## 2016-09-21 LAB — BASIC METABOLIC PANEL
ANION GAP: 8 (ref 5–15)
BUN: 9 mg/dL (ref 6–20)
CALCIUM: 9.2 mg/dL (ref 8.9–10.3)
CO2: 27 mmol/L (ref 22–32)
Chloride: 99 mmol/L — ABNORMAL LOW (ref 101–111)
Creatinine, Ser: 0.83 mg/dL (ref 0.44–1.00)
GFR calc Af Amer: >60 mL/min (ref >60)
GFR calc non Af Amer: >60 mL/min (ref >60)
GLUCOSE: 314 mg/dL — AB (ref 65–99)
Potassium: 3.4 mmol/L — ABNORMAL LOW (ref 3.5–5.1)
Sodium: 134 mmol/L — ABNORMAL LOW (ref 135–145)

## 2016-09-21 LAB — CBG MONITORING, ED: Glucose-Capillary: 283 mg/dL — ABNORMAL HIGH (ref 65–99)

## 2016-09-21 MED ORDER — POTASSIUM CHLORIDE CRYS ER 20 MEQ PO TBCR
40.0000 meq | EXTENDED_RELEASE_TABLET | Freq: Once | ORAL | Status: AC
  Administered 2016-09-21: 40 meq via ORAL
  Filled 2016-09-21: qty 2

## 2016-09-21 MED ORDER — FLUCONAZOLE 150 MG PO TABS: 150.0000 mg | ORAL_TABLET | Freq: Once | ORAL | 0 refills | Status: AC

## 2016-09-21 MED ORDER — ONDANSETRON 4 MG PO TBDP
4.0000 mg | ORAL_TABLET | Freq: Once | ORAL | Status: AC
  Administered 2016-09-21: 4 mg via ORAL
  Filled 2016-09-21: qty 1

## 2016-09-21 MED ORDER — METRONIDAZOLE 500 MG PO TABS
2000.0000 mg | ORAL_TABLET | Freq: Once | ORAL | Status: AC
  Administered 2016-09-21: 2000 mg via ORAL
  Filled 2016-09-21: qty 4

## 2016-09-22 LAB — GC/CHLAMYDIA PROBE AMP (~~LOC~~) NOT AT ARMC
Chlamydia: NEGATIVE
Neisseria Gonorrhea: NEGATIVE

## 2016-09-28 ENCOUNTER — Emergency Department (HOSPITAL_BASED_OUTPATIENT_CLINIC_OR_DEPARTMENT_OTHER)
Admission: EM | Admit: 2016-09-28 | Discharge: 2016-09-29 | Disposition: A | Discharge status: Home / Self Care | Payer: Medicaid Other | Attending: Emergency Medicine | Admitting: Emergency Medicine

## 2016-09-28 ENCOUNTER — Encounter (HOSPITAL_BASED_OUTPATIENT_CLINIC_OR_DEPARTMENT_OTHER): Payer: Self-pay

## 2016-09-28 DIAGNOSIS — E1065 Type 1 diabetes mellitus with hyperglycemia: Secondary | ICD-10-CM | POA: Diagnosis not present

## 2016-09-28 DIAGNOSIS — N760 Acute vaginitis: Secondary | ICD-10-CM | POA: Diagnosis not present

## 2016-09-28 DIAGNOSIS — Z113 Encounter for screening for infections with a predominantly sexual mode of transmission: Secondary | ICD-10-CM | POA: Insufficient documentation

## 2016-09-28 DIAGNOSIS — N898 Other specified noninflammatory disorders of vagina: Secondary | ICD-10-CM | POA: Diagnosis present

## 2016-09-28 DIAGNOSIS — Z711 Person with feared health complaint in whom no diagnosis is made: Secondary | ICD-10-CM

## 2016-09-28 DIAGNOSIS — B9689 Other specified bacterial agents as the cause of diseases classified elsewhere: Secondary | ICD-10-CM | POA: Diagnosis not present

## 2016-09-28 DIAGNOSIS — R739 Hyperglycemia, unspecified: Secondary | ICD-10-CM

## 2016-09-28 DIAGNOSIS — Z794 Long term (current) use of insulin: Secondary | ICD-10-CM | POA: Diagnosis not present

## 2016-09-28 LAB — CBC WITH DIFFERENTIAL/PLATELET
BASOS PCT: 0 %
Basophils Absolute: 0 10*3/uL (ref 0.0–0.1)
Eosinophils Absolute: 0.3 10*3/uL (ref 0.0–0.7)
Eosinophils Relative: 4 %
HEMATOCRIT: 37.3 % (ref 36.0–46.0)
HEMOGLOBIN: 13 g/dL (ref 12.0–15.0)
LYMPHS ABS: 3.2 10*3/uL (ref 0.7–4.0)
LYMPHS PCT: 44 %
MCH: 30.2 pg (ref 26.0–34.0)
MCHC: 34.9 g/dL (ref 30.0–36.0)
MCV: 86.7 fL (ref 78.0–100.0)
MONO ABS: 0.4 10*3/uL (ref 0.1–1.0)
MONOS PCT: 6 %
NEUTROS ABS: 3.3 10*3/uL (ref 1.7–7.7)
Neutrophils Relative %: 46 %
PLATELETS: 255 10*3/uL (ref 150–400)
RBC: 4.3 MIL/uL (ref 3.87–5.11)
RDW: 12.3 % (ref 11.5–15.5)
WBC: 7.2 10*3/uL (ref 4.0–10.5)

## 2016-09-28 LAB — BASIC METABOLIC PANEL
ANION GAP: 7 (ref 5–15)
BUN: 10 mg/dL (ref 6–20)
CALCIUM: 8.9 mg/dL (ref 8.9–10.3)
CHLORIDE: 99 mmol/L — AB (ref 101–111)
CO2: 27 mmol/L (ref 22–32)
Creatinine, Ser: 0.73 mg/dL (ref 0.44–1.00)
GFR calc Af Amer: >60 mL/min (ref >60)
GFR calc non Af Amer: >60 mL/min (ref >60)
GLUCOSE: 352 mg/dL — AB (ref 65–99)
Potassium: 3.6 mmol/L (ref 3.5–5.1)
Sodium: 133 mmol/L — ABNORMAL LOW (ref 135–145)

## 2016-09-28 LAB — URINALYSIS, MICROSCOPIC (REFLEX): RBC / HPF: NONE SEEN RBC/hpf (ref 0–5)

## 2016-09-28 LAB — URINALYSIS, ROUTINE W REFLEX MICROSCOPIC
Bilirubin Urine: NEGATIVE
Glucose, UA: >=500 mg/dL — AB
HGB URINE DIPSTICK: NEGATIVE
KETONES UR: NEGATIVE mg/dL
Nitrite: NEGATIVE
PROTEIN: NEGATIVE mg/dL
Specific Gravity, Urine: 1.044 — ABNORMAL HIGH (ref 1.005–1.030)
pH: 7 (ref 5.0–8.0)

## 2016-09-28 LAB — PREGNANCY, URINE: PREG TEST UR: NEGATIVE

## 2016-09-28 LAB — CBG MONITORING, ED: Glucose-Capillary: 342 mg/dL — ABNORMAL HIGH (ref 65–99)

## 2016-09-28 MED ORDER — SODIUM CHLORIDE 0.9 % IV BOLUS (SEPSIS)
1000.0000 mL | Freq: Once | INTRAVENOUS | Status: AC
  Administered 2016-09-28: 1000 mL via INTRAVENOUS

## 2016-09-28 NOTE — ED Triage Notes (Signed)
C/o vaginal d/c x 2 days-requests STD screening-steady gait

## 2016-09-28 NOTE — ED Provider Notes (Signed)
[facility]   CSN: [id number] Arrival date & time: 09/28/16  1324  By signing my name below, I, Nelwyn Salisbury, attest that this documentation has been prepared under the direction and in the presence of non-physician practitioner, Sharen Heck, PA-C. Electronically Signed: Nelwyn Salisbury, Scribe. 09/28/2016. 9:29 PM.  History   Chief Complaint Chief Complaint  Patient presents with  . Vaginal Discharge   The history is provided by the patient. No language interpreter was used.    HPI Comments:  Elaine Vasquez is a 26 y.o. female with pmhx T1DM (poorly controled BG) who presents to the Emergency Department complaining of increased thin/white vaginal discharge onset 2 days. Pt describes her discharge as thick and slightly yellow.  Pt reports associated lower abdominal pain described as a cramping, she attributes this to recent menstruation.  She was seen for similar symptoms about 1 week ago and treated for STDs. She states that since then, she had unprotected sex again with same partner who she suspects may have STDs. Denies any urinary symptoms.  Pt is not currently taking any birth control.   Past Medical History:  Diagnosis Date  . Chronic midline thoracic back pain   . Diabetes mellitus without complication (HCC)   . Muscle spasm   . Noncompliance with medications   . Polyneuropathy   . Scoliosis   . Vaginal candida     There are no active problems to display for this patient.   History reviewed. No pertinent surgical history.  OB History    Gravida Para Term Preterm AB Living   1 1           SAB TAB Ectopic Multiple Live Births                   Home Medications    Prior to Admission medications   Medication Sig Start Date End Date Taking? Authorizing Provider  insulin aspart (NOVOLOG) 100 unit/mL injection Inject 10 Units into the skin 3 (three) times daily before meals.    [provider]  insulin glargine (LANTUS) 100  UNIT/ML injection Inject into the skin at bedtime.    [provider]  metroNIDAZOLE (FLAGYL) 500 MG tablet Take 1 tablet (500 mg total) by mouth 2 (two) times daily. 09/29/16   Liberty Handy, PA-C    Family History No family history on file.  Social History Social History  Substance Use Topics  . Smoking status: Never Smoker  . Smokeless tobacco: Never Used  . Alcohol use Yes     Comment: social     Allergies   Patient has no known allergies.   Review of Systems Review of Systems  Constitutional: Negative for chills and fever.  HENT: Negative for congestion and sore throat.   Respiratory: Negative for cough, chest tightness and shortness of breath.   Cardiovascular: Negative for chest pain, palpitations and leg swelling.  Gastrointestinal: Positive for abdominal pain. Negative for constipation, diarrhea, nausea and vomiting.  Genitourinary: Positive for vaginal discharge. Negative for difficulty urinating, dysuria and hematuria.  Musculoskeletal: Negative for back pain.  Skin: Negative for rash.  Neurological: Negative for dizziness, seizures, syncope, weakness, light-headedness, numbness and headaches.  Hematological: Does not bruise/bleed easily.  Psychiatric/Behavioral: Negative.      Physical Exam Updated Vital Signs BP 119/83 (BP Location: Right Arm)   Pulse 79   Temp 99.3 F (37.4 C) (Oral)   Resp 18   Ht 5\' 3"  (1.6 m)   Wt 68  kg   LMP 09/22/2016   SpO2 100%   BMI 26.57 kg/m   Physical Exam  Constitutional: She is oriented to person, place, and time. She appears well-developed and well-nourished. She is cooperative. No distress.  HENT:  Head: Normocephalic and atraumatic.  Nose: Nose normal.  Mouth/Throat: Oropharynx is clear and moist. No oropharyngeal exudate.  Eyes: Conjunctivae and EOM are normal. Pupils are equal, round, and reactive to light. No scleral icterus.  Neck: Normal range of motion. Neck supple. No JVD present.    Cardiovascular: Normal rate, regular rhythm, normal heart sounds and intact distal pulses.   No murmur heard. Pulmonary/Chest: Effort normal and breath sounds normal. No respiratory distress. She has no wheezes. She has no rales. She exhibits no tenderness.  Abdominal:  No surgical abdominal scars noted.  Active bowel sounds throughout.  Abdomen is soft, non tender without distention, rigidity, guarding or rebound.  No suprapubic tenderness. No CVAT.  Negative Murphy's. Negative McBurney's. Negative Psoas sign. Non palpable kidneys. No hepatosplenomegaly.   Genitourinary: Vaginal discharge found.  Genitourinary Comments: +Mild/moderate thin white discharge in vaginal vault External genitalia normal without erythema, edema, tenderness, discharge or lesions.  No groin lymphadenopathy.  Vaginal mucosa and cervix normal, pink without lesions.  Uterus in midline, smooth, not enlarged or tender. No CMT. Non palpable adnexa.  Musculoskeletal: Normal range of motion. She exhibits no deformity.  Lymphadenopathy:    She has no cervical adenopathy.  Neurological: She is alert and oriented to person, place, and time. No sensory deficit.  Skin: Skin is warm and dry. Capillary refill takes less than 2 seconds.  Psychiatric: She has a normal mood and affect. Her behavior is normal. Judgment and thought content normal.  Nursing note and vitals reviewed.    ED Treatments / Results  DIAGNOSTIC STUDIES:  Oxygen Saturation is 100% on RA, normal by my interpretation.    COORDINATION OF CARE:  10:30 PM Discussed treatment plan with pt at bedside which includes STD testing and pt agreed to plan.  Labs (all labs ordered are listed, but only abnormal results are displayed) Labs Reviewed  WET PREP, GENITAL - Abnormal; Notable for the following:       Result Value   Clue Cells Wet Prep HPF POC PRESENT (*)    WBC, Wet Prep HPF POC MANY (*)    All other components within normal limits  URINALYSIS,  ROUTINE W REFLEX MICROSCOPIC - Abnormal; Notable for the following:    APPearance CLOUDY (*)    Specific Gravity, Urine 1.044 (*)    Glucose, UA >=500 (*)    Leukocytes, UA SMALL (*)    All other components within normal limits  URINALYSIS, MICROSCOPIC (REFLEX) - Abnormal; Notable for the following:    Bacteria, UA RARE (*)    Squamous Epithelial / LPF 6-30 (*)    All other components within normal limits  BASIC METABOLIC PANEL - Abnormal; Notable for the following:    Sodium 133 (*)    Chloride 99 (*)    Glucose, Bld 352 (*)    All other components within normal limits  CBG MONITORING, ED - Abnormal; Notable for the following:    Glucose-Capillary 342 (*)    All other components within normal limits  CBG MONITORING, ED - Abnormal; Notable for the following:    Glucose-Capillary 307 (*)    All other components within normal limits  PREGNANCY, URINE  CBC WITH DIFFERENTIAL/PLATELET  GC/CHLAMYDIA PROBE AMP ([place]) [facility]  GC/CHLAMYDIA PROBE  AMP ([place]) NOT AT [facility]    EKG  EKG Interpretation None       Radiology No results found.  Procedures Procedures (including critical care time)  Medications Ordered in ED Medications  sodium chloride 0.9 % bolus 1,000 mL (0 mLs Intravenous Stopped 09/29/16 0044)     Initial Impression / Assessment and Plan / ED Course  I have reviewed the triage vital signs and the nursing notes.  Pertinent labs & imaging results that were available during my care of the patient were reviewed by me and considered in my medical decision making (see chart for details).    26 year old female with history of type 1 diabetes mellitus, poorly controlled with home insulin regimen, presents to the ED reporting increased vaginal discharge 2 days. She reports exposure to unprotected sexual and oral intercourse, she suspects partner may have an STD and is requesting STD check. Patient found to be hyperglycemic with CBG at 342. No evidence of  acidosis. Patient had had soda prior to CBG checked. Patient was given 1 L IV fluids, CBG decreased subsequently. Wet prep showed bacterial vaginosis, negative for trichomoniasis or yeast infection. Discussed findings with patient. She will be discharged with Flagyl. Pending oral gonorrhea/chlamydia (patient requested) and cervical gonorrhea/chlamydia results.  Encouraged patient to improve home insulin dosing for better blood glucose control. I recommended she contact primary care provider for further discussion of insulin regimen and poorly controlled BG. Patient verbalized understanding and is agreeable with plan. Patient discussed with supervising physician who agrees with ED treatment and discharge plan.  Final Clinical Impressions(s) / ED Diagnoses   Final diagnoses:  Vaginal discharge  Concern about STD in female without diagnosis  BV (bacterial vaginosis)  Hyperglycemia    New Prescriptions New Prescriptions   METRONIDAZOLE (FLAGYL) 500 MG TABLET    Take 1 tablet (500 mg total) by mouth 2 (two) times daily.   I personally performed the services described in this documentation, which was scribed in my presence. The recorded information has been reviewed and is accurate.    Liberty Handy, PA-C 09/29/16 Lovenia Kim, MD 09/30/16 [phone]

## 2016-09-29 LAB — WET PREP, GENITAL
Sperm: NONE SEEN
TRICH WET PREP: NONE SEEN
YEAST WET PREP: NONE SEEN

## 2016-09-29 LAB — CBG MONITORING, ED: Glucose-Capillary: 307 mg/dL — ABNORMAL HIGH (ref 65–99)

## 2016-09-29 MED ORDER — METRONIDAZOLE 500 MG PO TABS: 500.0000 mg | ORAL_TABLET | Freq: Two times a day (BID) | ORAL | 0 refills | Status: DC

## 2016-09-29 NOTE — Discharge Instructions (Signed)
Today we tested you for gonorrhea, chlamydia, trichomoniasis, bacterial vaginosis and yeast infection.  You have bacterial vaginosis, please read attached information.  Bacterial vaginosis is NOT a sexually transmitted disease. You do not have trichomoniasis or a yeast infection.  Your gonorrhea and chlamydia testing is not yet back, we will contact you in the next 2-3 days IF your results are positive.   Please take flagyl as prescribed. AVOID DRINKING ALCOHOL WITH FLAGYL AS YOU WILL HAVE SEVERE ABDOMINAL PAIN, NAUSEA AND VOMITING.   You were noted to have elevated blood sugar today.  This appears to be an ongoing problem for you.  Over time, poorly controlled blood glucose can lead to many problems.  Please contact her primary care provider as soon as possible to discuss your insulin regimen and come up with a better plan to better control your blood glucose at home.

## 2016-09-30 LAB — GC/CHLAMYDIA PROBE AMP (~~LOC~~) NOT AT ARMC
Chlamydia: NEGATIVE
Chlamydia: NEGATIVE
NEISSERIA GONORRHEA: NEGATIVE
Neisseria Gonorrhea: NEGATIVE

## 2016-10-13 ENCOUNTER — Emergency Department (HOSPITAL_BASED_OUTPATIENT_CLINIC_OR_DEPARTMENT_OTHER)
Admission: EM | Admit: 2016-10-13 | Discharge: 2016-10-13 | Disposition: A | Discharge status: Home / Self Care | Payer: Medicaid Other | Attending: Emergency Medicine | Admitting: Emergency Medicine

## 2016-10-13 ENCOUNTER — Encounter (HOSPITAL_BASED_OUTPATIENT_CLINIC_OR_DEPARTMENT_OTHER): Payer: Self-pay

## 2016-10-13 ENCOUNTER — Emergency Department (HOSPITAL_BASED_OUTPATIENT_CLINIC_OR_DEPARTMENT_OTHER): Payer: Medicaid Other

## 2016-10-13 DIAGNOSIS — B373 Candidiasis of vulva and vagina: Secondary | ICD-10-CM | POA: Diagnosis not present

## 2016-10-13 DIAGNOSIS — Z794 Long term (current) use of insulin: Secondary | ICD-10-CM | POA: Insufficient documentation

## 2016-10-13 DIAGNOSIS — B3731 Acute candidiasis of vulva and vagina: Secondary | ICD-10-CM

## 2016-10-13 DIAGNOSIS — R079 Chest pain, unspecified: Secondary | ICD-10-CM

## 2016-10-13 DIAGNOSIS — E119 Type 2 diabetes mellitus without complications: Secondary | ICD-10-CM | POA: Diagnosis not present

## 2016-10-13 DIAGNOSIS — R0789 Other chest pain: Secondary | ICD-10-CM | POA: Diagnosis not present

## 2016-10-13 DIAGNOSIS — F17290 Nicotine dependence, other tobacco product, uncomplicated: Secondary | ICD-10-CM | POA: Insufficient documentation

## 2016-10-13 LAB — WET PREP, GENITAL
CLUE CELLS WET PREP: NONE SEEN
Sperm: NONE SEEN
TRICH WET PREP: NONE SEEN
Yeast Wet Prep HPF POC: NONE SEEN

## 2016-10-13 LAB — PREGNANCY, URINE: Preg Test, Ur: NEGATIVE

## 2016-10-13 MED ORDER — MICONAZOLE NITRATE 2 % EX CREA: 1.0000 "application " | TOPICAL_CREAM | Freq: Two times a day (BID) | CUTANEOUS | 0 refills | Status: DC

## 2016-10-13 MED ORDER — FLUCONAZOLE 200 MG PO TABS: 200.0000 mg | ORAL_TABLET | Freq: Every day | ORAL | 0 refills | Status: AC

## 2016-10-13 MED ORDER — FLUCONAZOLE 100 MG PO TABS
200.0000 mg | ORAL_TABLET | Freq: Once | ORAL | Status: AC
  Administered 2016-10-13: 200 mg via ORAL
  Filled 2016-10-13: qty 2

## 2016-10-13 NOTE — ED Notes (Signed)
ED Provider at bedside. 

## 2016-10-13 NOTE — ED Triage Notes (Addendum)
C/o CP x 1-2 weeks-NAD-steady gait-also c/o vaginal itching

## 2016-10-13 NOTE — ED Provider Notes (Signed)
[facility] Provider Note   CSN: [id number] Arrival date & time: 10/13/16  1951 By signing my name below, I, Elsie Stain, attest that this documentation has been prepared under the direction and in the presence of Gwyneth Sprout, MD . Electronically Signed: Elsie Stain, ED Scribe. 10/13/2016. 9:20 PM.  History   Chief Complaint Chief Complaint  Patient presents with  . Chest Pain  . Vaginal Itching    HPI Elaine Vasquez is a 26 y.o. female with a history of DM and anxiety who presents to the Emergency Department complaining of intermittent, right sided chest pain onset 1.5 weeks ago. She describes this as intermittent, moderate right sided chest pain that radiates to her left lateral chest. Per pt, pain is brought on by stress. Pt reports associated palpitations, fatigue SOB, neck pain, and shoulder pain. SOB is exacerbated by stress and is unchanged with exertion. No recent injury or overuse. Per pt, she has had similar symptoms before and was told this was due to stress. Pt takes daily medications for IDDM, but reports that her sugars have been out of control lately, with her latest level of 9.5. Pt reports she has not been following her diet, and is not taking Novalog as directed. Pt also reports vaginal itching onset one week ago. Pt was seen for vaginal discharge one week ago where she was diagnosed with bacterial vaginosis and d/c with Flagyl. Pt states she is sexually active with and has unprotected sex with one partner. No concern for STI.  Pt per, her LNMP was on 09/22/16. Denies any vaginal discharge, cough, congestion.   The history is provided by the patient. No language interpreter was used.    Past Medical History:  Diagnosis Date  . Chronic midline thoracic back pain   . Diabetes mellitus without complication (HCC)   . Muscle spasm   . Noncompliance with medications   . Polyneuropathy   . Scoliosis   . Vaginal candida     There are no active problems  to display for this patient.   History reviewed. No pertinent surgical history.  OB History    Gravida Para Term Preterm AB Living   1 1           SAB TAB Ectopic Multiple Live Births                   Home Medications    Prior to Admission medications   Medication Sig Start Date End Date Taking? Authorizing Provider  insulin aspart (NOVOLOG) 100 unit/mL injection Inject 10 Units into the skin 3 (three) times daily before meals.    [provider]  insulin glargine (LANTUS) 100 UNIT/ML injection Inject into the skin at bedtime.    [provider]    Family History No family history on file.  Social History Social History  Substance Use Topics  . Smoking status: Current Every Day Smoker    Types: Cigars  . Smokeless tobacco: Never Used  . Alcohol use Yes    Allergies   Patient has no known allergies.   Review of Systems Review of Systems All systems reviewed and are negative for acute change except as noted in the HPI.  Physical Exam Updated Vital Signs BP 114/89 (BP Location: Left Arm)   Pulse 90   Temp 98.4 F (36.9 C) (Oral)   Resp 18   Ht 5\' 3"  (1.6 m)   Wt 149 lb (67.6 kg)   LMP 09/22/2016   SpO2 100%  BMI 26.39 kg/m   Physical Exam  Constitutional: She appears well-developed and well-nourished. No distress.  HENT:  Head: Normocephalic and atraumatic.  Eyes: Conjunctivae are normal.  Neck: Normal range of motion.  Cardiovascular: Normal rate and regular rhythm.  Exam reveals no gallop and no friction rub.   No murmur heard. No chest wall tenderness.  Pulmonary/Chest: Effort normal. No respiratory distress. She has no wheezes. She has no rales.  Abdominal: Soft. She exhibits no distension. There is no tenderness. There is no guarding.  Genitourinary:  Genitourinary Comments: Chaperone present. Irritation and scant white discharge. No cervical motion tenderness, no adenexal tenderness. No lesion or rash   Musculoskeletal:  Normal range of motion.  Neurological: She is alert.  Skin: No pallor.  Psychiatric: She has a normal mood and affect. Her behavior is normal.  Nursing note and vitals reviewed.   ED Treatments / Results  DIAGNOSTIC STUDIES:  Oxygen Saturation is 100% on RA, normal by my interpretation.    COORDINATION OF CARE:  9:38 PM Discussed treatment plan with pt at bedside and pt agreed to plan.   Labs (all labs ordered are listed, but only abnormal results are displayed) Labs Reviewed  WET PREP, GENITAL - Abnormal; Notable for the following:       Result Value   WBC, Wet Prep HPF POC MANY (*)    All other components within normal limits  PREGNANCY, URINE  GC/CHLAMYDIA PROBE AMP ([place]) NOT AT [facility]    EKG  EKG Interpretation  Date/Time:  Wednesday Oct 13 2016 20:01:54 EDT Ventricular Rate:  90 PR Interval:  148 QRS Duration: 80 QT Interval:  352 QTC Calculation: 430 R Axis:   82 Text Interpretation:  Normal sinus rhythm Normal ECG No previous tracing Confirmed by Gwyneth SproutPlunkett, Jensine Luz ([id number]) on 10/13/2016 8:31:31 PM       Radiology Dg Chest 2 View  Result Date: 10/13/2016 CLINICAL DATA:  26 year old female with right-sided chest pain. EXAM: CHEST  2 VIEW COMPARISON:  Chest radiograph dated 05/19/2016 FINDINGS: The heart size and mediastinal contours are within normal limits. Both lungs are clear. The visualized skeletal structures are unremarkable. IMPRESSION: No active cardiopulmonary disease. Electronically Signed   By: Elgie CollardArash  Radparvar M.D.   On: 10/13/2016 21:56    Procedures Procedures (including critical care time)  Medications Ordered in ED Medications  fluconazole (DIFLUCAN) tablet 200 mg (200 mg Oral Given 10/13/16 2238)     Initial Impression / Assessment and Plan / ED Course  I have reviewed the triage vital signs and the nursing notes.  Pertinent labs & imaging results that were available during my care of the patient were reviewed by me and considered  in my medical decision making (see chart for details).    Pt with 2 complaints today.  Initial complaint is of atypical right-sided chest pain that's been there intermittently for the last week. She has no coughing or infectious symptoms related to this. Patient states it's usually worse with stress which she's been under a lot recently. She states occasionally will make her short of breath. She has no significant cardiac history except that she will monitor years ago but nothing came of it. She denies any abdominal pain, vomiting. She does smoke cigarettes but denies any history of asthma. Lungs are clear on exam in a patient is in no acute distress. She is not tachycardic and she is satting 100% on room air. EKG and chest x-ray are within normal limits. Low suspicion that this is  ACS, PERC neg, dissection, PE or pneumothorax. Recommended Tylenol as needed and follow-up if symptoms do not improve. Secondly patient states last week she was diagnosed with bacterial vaginosis. She had been taking Flagyl and finished yesterday. She also noticed development of itching, irritation and burning in the vaginal area over the last few days. She is a type I diabetic and has been taking her insulin but states is just recently started becoming more vigilant on taking it appropriately. Her hemoglobin A1c used to be in the 6 range and in the last month when it was rechecked it was 9. She states she was just not eating appropriately and was not taking her NovoLog the way she should. More recently she has been trying to be better. On vaginal exam low suspicion for sexual transmitted infection but a concern for recent given she is diabetic and recently on antibiotics she was treated with Diflucan. Urine pregnancy test is negative. Wet prep is negative for trichomonas. GC and Chlamydia were sent. She has no abdominal pain, nausea or vomiting concerning for DKA. No symptoms concerning for ovarian cyst or PID.   Final Clinical  Impressions(s) / ED Diagnoses   Final diagnoses:  Vaginal yeast infection  Nonspecific chest pain    New Prescriptions Discharge Medication List as of 10/13/2016 10:43 PM    START taking these medications   Details  fluconazole (DIFLUCAN) 200 MG tablet Take 1 tablet (200 mg total) by mouth daily., Starting Wed 10/13/2016, Until Sat 10/16/2016, Print    miconazole (MICONAZOLE ANTIFUNGAL) 2 % cream Apply 1 application topically 2 (two) times daily., Starting Wed 10/13/2016, Print        I personally performed the services described in this documentation, which was scribed in my presence.  The recorded information has been reviewed and considered.    Gwyneth Sprout, MD 10/13/16 2322

## 2016-10-13 NOTE — ED Notes (Signed)
Patient transported to X-ray 

## 2016-10-13 NOTE — ED Notes (Signed)
Pt and visitors given snacks per request and permission from MD.

## 2016-10-13 NOTE — Discharge Instructions (Signed)
Outpatient Psychiatry and Counseling  [facility]: Mobile Crisis Management 24 hours:  [phone]  [facility] sliding scale fee and walk in schedule: M-F 8am-12pm/1pm-3pm [address]  [place], [place] [address] [phone]  [facility] [address] [place], [place] [address] [phone]  [facility] ([facility])- new patient walk-in appointments available Monday - Friday 8am -3pm.          [address] [place], [place] [address] [phone] or crisis line- [phone]  [facility]/ Intensive Outpatient Therapy Program [address] [place], [place] [address] [phone]  [facility]                  Crisis Services      [phone]      [address]     [place], [place] [address]                 [facility]   [facility] [phone] [address] [place], [place] [address]   [facility]          [address],  [place], [place] [address]       [phone]  [facility] [address] [place], [place] [address] [phone]  [facility]    [address]    [place], [place] [address]     [phone]       Parish McKinney, MD     [address]     [place] [address]     [phone]       [facility] [address] [place] [address]  [facility]     [address]     [place], [place]      [phone]       [facility] Shamsher Ahluwalia, MD [address] [place], [place] [address] [phone]  [facility]     [address]     [place], [place] [address]     [phone]       [facility] [address] [place], [place] [address] [phone] Resources for Temporary  Residential Assistance/Crisis Centers  [facility] M-F 8am-3pm   [address] [place], [place] [address]   [phone] Services include: laundry, barbering, support groups, case management, phone  & computer access, showers, AA/NA mtgs, mental health/substance abuse nurse, job skills class, disability information, VA assistance, spiritual classes, etc.   HOMELESS SHELTERS  [facility]     [facility]   [address], [place]     [phone]              [facility] (women and children)       [address] [place], [place] [address] [phone] [email or web address] for application and process Application Required  [facility]   [address]    [place] [address]     [phone]                    [facility] [address] [place], [place] [address] [phone](schedule application appt.) Application Required  [facility] (women only)    [address]     [place],  [address]       [phone]      Intake starts 6pm daily Need valid ID, SSC, & Police report [facility] [place] [address] [place], [place] [phone] Application Required  [facility] (men only)     [address]      [place], [place]     [phone]       [facility] (Pregnant women only) [address] [place], [place] [phone]  [facility]      [address]      [place], [place] [address]     [phone]             [place] [facility] [address] [place], [place] [phone] 90 day commitment/SA/Application process  [facility](men only)     [address]     [place], [place]     [phone]       Check-in at 7pm            [facility] [address] [place], [place] [address] [phone] Men/Women/Women and Children  must be there by 7 pm  [facility] [place],  [phone]                 

## 2016-10-13 NOTE — ED Notes (Signed)
Pt states "my chest is under stress."

## 2016-10-15 LAB — GC/CHLAMYDIA PROBE AMP (~~LOC~~) NOT AT ARMC
CHLAMYDIA, DNA PROBE: NEGATIVE
Neisseria Gonorrhea: NEGATIVE

## 2016-11-07 ENCOUNTER — Encounter (HOSPITAL_BASED_OUTPATIENT_CLINIC_OR_DEPARTMENT_OTHER): Payer: Self-pay | Admitting: Emergency Medicine

## 2016-11-07 ENCOUNTER — Emergency Department (HOSPITAL_BASED_OUTPATIENT_CLINIC_OR_DEPARTMENT_OTHER)
Admission: EM | Admit: 2016-11-07 | Discharge: 2016-11-08 | Disposition: A | Discharge status: Home / Self Care | Payer: Medicaid Other | Attending: Emergency Medicine | Admitting: Emergency Medicine

## 2016-11-07 DIAGNOSIS — Z79899 Other long term (current) drug therapy: Secondary | ICD-10-CM | POA: Diagnosis not present

## 2016-11-07 DIAGNOSIS — Z794 Long term (current) use of insulin: Secondary | ICD-10-CM | POA: Diagnosis not present

## 2016-11-07 DIAGNOSIS — F17290 Nicotine dependence, other tobacco product, uncomplicated: Secondary | ICD-10-CM | POA: Insufficient documentation

## 2016-11-07 DIAGNOSIS — E119 Type 2 diabetes mellitus without complications: Secondary | ICD-10-CM | POA: Insufficient documentation

## 2016-11-07 DIAGNOSIS — N898 Other specified noninflammatory disorders of vagina: Secondary | ICD-10-CM | POA: Diagnosis not present

## 2016-11-07 LAB — WET PREP, GENITAL
Clue Cells Wet Prep HPF POC: NONE SEEN
SPERM: NONE SEEN
TRICH WET PREP: NONE SEEN
YEAST WET PREP: NONE SEEN

## 2016-11-07 LAB — URINALYSIS, ROUTINE W REFLEX MICROSCOPIC
Bilirubin Urine: NEGATIVE
Hgb urine dipstick: NEGATIVE
Ketones, ur: NEGATIVE mg/dL
LEUKOCYTES UA: NEGATIVE
Nitrite: NEGATIVE
PH: 7 (ref 5.0–8.0)
PROTEIN: NEGATIVE mg/dL
SPECIFIC GRAVITY, URINE: 1.041 — AB (ref 1.005–1.030)

## 2016-11-07 LAB — CBG MONITORING, ED: Glucose-Capillary: 294 mg/dL — ABNORMAL HIGH (ref 65–99)

## 2016-11-07 LAB — URINALYSIS, MICROSCOPIC (REFLEX)

## 2016-11-07 LAB — PREGNANCY, URINE: Preg Test, Ur: NEGATIVE

## 2016-11-07 MED ORDER — AZITHROMYCIN 250 MG PO TABS
1000.0000 mg | ORAL_TABLET | Freq: Once | ORAL | Status: AC
  Administered 2016-11-07: 1000 mg via ORAL
  Filled 2016-11-07: qty 4

## 2016-11-07 MED ORDER — LIDOCAINE HCL (PF) 1 % IJ SOLN
INTRAMUSCULAR | Status: AC
  Filled 2016-11-07: qty 5

## 2016-11-07 MED ORDER — DOXYCYCLINE HYCLATE 100 MG PO TABS: 100.0000 mg | ORAL_TABLET | Freq: Once | ORAL | Status: DC

## 2016-11-07 MED ORDER — CEFTRIAXONE SODIUM 250 MG IJ SOLR
INTRAMUSCULAR | Status: AC
  Filled 2016-11-07: qty 250

## 2016-11-07 MED ORDER — FLUCONAZOLE 100 MG PO TABS: 200.0000 mg | ORAL_TABLET | Freq: Once | ORAL | Status: DC

## 2016-11-07 MED ORDER — CEFTRIAXONE SODIUM 250 MG IJ SOLR
250.0000 mg | Freq: Once | INTRAMUSCULAR | Status: AC
  Administered 2016-11-07: 250 mg via INTRAMUSCULAR
  Filled 2016-11-07: qty 250

## 2016-11-07 NOTE — ED Provider Notes (Signed)
[facility]ENCY [facility]PT [facility] Provider Note   CSN: [id number] Arrival date & time: 11/07/16  1920  By signing my name below, I, Linna Darner, attest that this documentation has been prepared under the direction and in the presence of Casson Catena, PA-C. Electronically Signed: Linna Darner, Scribe. 11/07/2016. 9:16 PM.  History   Chief Complaint Chief Complaint  Patient presents with  . Vaginal Itching   The history is provided by the patient. No language interpreter was used.    HPI Comments: Elaine Vasquez is a 26 y.o. female with PMHx including DM and vaginal candida who presents to the Emergency Department complaining of persistent vaginal itching for three days. She reports some associated white vaginal discharge. Patient states her symptoms are consistent with past yeast infections. She is additionally reporting concern for pregnancy. Her LNMP was two months ago; she has had menses this month and last month but they were both irregular. She states, "Urine pregnancy tests don't work for me" and has not taken any recently. Patient denies vaginal pain, fevers, chills, abdominal pain, or any other associated symptoms.    Past Medical History:  Diagnosis Date  . Chronic midline thoracic back pain   . Diabetes mellitus without complication (HCC)   . Muscle spasm   . Noncompliance with medications   . Polyneuropathy   . Scoliosis   . Vaginal candida     There are no active problems to display for this patient.   History reviewed. No pertinent surgical history.  OB History    Gravida Para Term Preterm AB Living   1 1           SAB TAB Ectopic Multiple Live Births                   Home Medications    Prior to Admission medications   Medication Sig Start Date End Date Taking? Authorizing Provider  glucose blood (ACCU-CHEK GUIDE) test strip Use as instructed 11/08/16   Legna Mausolf C, PA-C  insulin aspart (NOVOLOG) 100 unit/mL injection Inject 10 Units into the skin 3 (three)  times daily before meals.    [provider]  insulin glargine (LANTUS) 100 UNIT/ML injection Inject into the skin at bedtime.    [provider]  miconazole (MICONAZOLE ANTIFUNGAL) 2 % cream Apply 1 application topically 2 (two) times daily. 10/13/16   Gwyneth Sprout, MD    Family History History reviewed. No pertinent family history.  Social History Social History  Substance Use Topics  . Smoking status: Current Every Day Smoker    Types: Cigars  . Smokeless tobacco: Never Used  . Alcohol use Yes     Allergies   Patient has no known allergies.   Review of Systems Review of Systems  Constitutional: Negative for chills and fever.  Gastrointestinal: Negative for abdominal pain.  Genitourinary: Positive for vaginal discharge. Negative for vaginal bleeding and vaginal pain.       Positive for vaginal itching.  All other systems reviewed and are negative.  Physical Exam Updated Vital Signs BP 129/75 (BP Location: Right Arm)   Pulse 93   Temp 98.5 F (36.9 C) (Oral)   Resp 18   Ht 5\' 3"  (1.6 m)   Wt 153 lb (69.4 kg)   LMP  (LMP Unknown)   SpO2 100%   BMI 27.10 kg/m   Physical Exam  Constitutional: She appears well-developed and well-nourished. No distress.  HENT:  Head: Normocephalic and atraumatic.  Eyes: Conjunctivae are normal.  Neck: Neck supple.  Cardiovascular: Normal rate and regular rhythm.   Pulmonary/Chest: Effort normal.  Genitourinary:  Genitourinary Comments: External genitalia normal Vagina with discharge - Thick, white, moderate discharge Cervix  abnormal  erythema and minor erosion around the cervical os negative for cervical motion tenderness Adnexa palpated, no masses, negative for tenderness noted Bladder palpated negative for tenderness Uterus palpated no masses, negative for tenderness  Otherwise normal female genitalia. Med Tech, Natalia LeatherwoodKatherine, served as Biomedical engineerchaperone during exam.  Neurological: She is alert.  Skin: Skin is  warm and dry. She is not diaphoretic.  Psychiatric: She has a normal mood and affect. Her behavior is normal.  Nursing note and vitals reviewed.  ED Treatments / Results  Labs (all labs ordered are listed, but only abnormal results are displayed) Labs Reviewed  WET PREP, GENITAL - Abnormal; Notable for the following:       Result Value   WBC, Wet Prep HPF POC MANY (*)    All other components within normal limits  URINALYSIS, ROUTINE W REFLEX MICROSCOPIC - Abnormal; Notable for the following:    Specific Gravity, Urine 1.041 (*)    Glucose, UA >=500 (*)    All other components within normal limits  URINALYSIS, MICROSCOPIC (REFLEX) - Abnormal; Notable for the following:    Bacteria, UA RARE (*)    Squamous Epithelial / LPF 6-30 (*)    All other components within normal limits  CBG MONITORING, ED - Abnormal; Notable for the following:    Glucose-Capillary 294 (*)    All other components within normal limits  PREGNANCY, URINE  RPR  HIV ANTIBODY (ROUTINE TESTING)  HCG, QUANTITATIVE, PREGNANCY  GC/CHLAMYDIA PROBE AMP ([place]) NOT AT [facility]    EKG  EKG Interpretation None       Radiology No results found.  Procedures Pelvic exam Date/Time: 11/07/2016 9:31 PM Performed by: Anselm PancoastJOY, Amadu Schlageter C Authorized by: Harolyn RutherfordJOY, Elpidio Thielen C  Consent: Verbal consent obtained. Risks and benefits: risks, benefits and alternatives were discussed Consent given by: patient Patient understanding: patient states understanding of the procedure being performed Patient consent: the patient's understanding of the procedure matches consent given Procedure consent: procedure consent matches procedure scheduled Patient identity confirmed: verbally with patient and arm band Local anesthesia used: no  Anesthesia: Local anesthesia used: no  Sedation: Patient sedated: no Patient tolerance: Patient tolerated the procedure well with no immediate complications    (including critical care time)  DIAGNOSTIC  STUDIES: Oxygen Saturation is 100% on RA, normal by my interpretation.    COORDINATION OF CARE: 9:12 PM Discussed treatment plan with pt at bedside and pt agreed to plan.  Medications Ordered in ED Medications  cefTRIAXone (ROCEPHIN) injection 250 mg (250 mg Intramuscular Given 11/07/16 2340)  azithromycin (ZITHROMAX) tablet 1,000 mg (1,000 mg Oral Given 11/07/16 2339)  fluconazole (DIFLUCAN) tablet 200 mg (200 mg Oral Given 11/08/16 0042)     Initial Impression / Assessment and Plan / ED Course  I have reviewed the triage vital signs and the nursing notes.  Pertinent labs & imaging results that were available during my care of the patient were reviewed by me and considered in my medical decision making (see chart for details).     Patient presents with vaginal discharge and irritation. Patient with evidence of Cervicitis and cervical erosion on exam. Treated for STDs prophylactically. Doubt PID. Needs OBGYN follow up. The importance of this follow-up was discussed in detail and at length. The patient was given instructions for home care as well  as return precautions. Patient voices understanding of these instructions, accepts the plan, and is comfortable with discharge.  Vital signs stable. Asymptomatic hyperglycemia. Low suspicion for DKA.  Note that this patient had a female visitor with her that was very hesitant to leave her side. When he was asked to step out of the room for any reason, the patient protested.  Vitals:   11/07/16 1931 11/07/16 2140 11/07/16 2359  BP: 129/75 126/72 99/61  Pulse: 93 81 65  Resp: 18 16 16   Temp: 98.5 F (36.9 C)    TempSrc: Oral    SpO2: 100% 100% 100%  Weight: 69.4 kg (153 lb)    Height: 5\' 3"  (1.6 m)     Difference in the patient's blood pressure was noted. Patient was asked about this. She stated that a blood pressure in the low 100s is more normal for her. She states that she was initially very nervous coming in here, but now feels much more  calm.  Final Clinical Impressions(s) / ED Diagnoses   Final diagnoses:  Vaginal discharge    New Prescriptions Discharge Medication List as of 11/08/2016 12:36 AM    START taking these medications   Details  glucose blood (ACCU-CHEK GUIDE) test strip Use as instructed, Print       I personally performed the services described in this documentation, which was scribed in my presence. The recorded information has been reviewed and is accurate.   Anselm Pancoast, PA-C 11/09/16 1642    Loren Racer, MD 11/12/16 1640

## 2016-11-07 NOTE — ED Notes (Signed)
Pt used bathroom and was not told about needing a sample. Pt given water per RN and asked to obtain a urine sample as soon as she can.

## 2016-11-07 NOTE — ED Triage Notes (Signed)
Patient states that she is having a yeast infection with "Thick, Thick thik" discharge. The patient also reports that she has had an abnormal period for the last few months, and could be pregnant, but "I dont do well with urine" - The patient reports that her blood sugar has been elevated as well

## 2016-11-08 LAB — HCG, QUANTITATIVE, PREGNANCY: hCG, Beta Chain, Quant, S: <5 m[IU]/mL (ref <5)

## 2016-11-08 LAB — GC/CHLAMYDIA PROBE AMP (~~LOC~~) NOT AT ARMC
CHLAMYDIA, DNA PROBE: NEGATIVE
Neisseria Gonorrhea: NEGATIVE

## 2016-11-08 MED ORDER — FLUCONAZOLE 100 MG PO TABS
200.0000 mg | ORAL_TABLET | Freq: Once | ORAL | Status: AC
  Administered 2016-11-08: 200 mg via ORAL
  Filled 2016-11-08: qty 2

## 2016-11-08 MED ORDER — GLUCOSE BLOOD VI STRP: ORAL_STRIP | 12 refills | Status: AC

## 2016-11-08 NOTE — Discharge Instructions (Signed)
Both your pregnancy tests were negative. Your cervix showed signs of inflammation. You have been treated with an antibiotic and an antifungal medication. Follow up with OBGYN as soon as possible on this matter. Be sure to control your blood sugars. This is the best thing you can do to avoid infection.

## 2016-11-09 LAB — RPR: RPR: NONREACTIVE

## 2016-11-09 LAB — HIV ANTIBODY (ROUTINE TESTING W REFLEX): HIV Screen 4th Generation wRfx: NONREACTIVE

## 2016-12-07 ENCOUNTER — Encounter (HOSPITAL_BASED_OUTPATIENT_CLINIC_OR_DEPARTMENT_OTHER): Payer: Self-pay

## 2016-12-07 DIAGNOSIS — N898 Other specified noninflammatory disorders of vagina: Secondary | ICD-10-CM | POA: Diagnosis present

## 2016-12-07 DIAGNOSIS — B373 Candidiasis of vulva and vagina: Secondary | ICD-10-CM | POA: Insufficient documentation

## 2016-12-07 DIAGNOSIS — F17290 Nicotine dependence, other tobacco product, uncomplicated: Secondary | ICD-10-CM | POA: Diagnosis not present

## 2016-12-07 DIAGNOSIS — E119 Type 2 diabetes mellitus without complications: Secondary | ICD-10-CM | POA: Diagnosis not present

## 2016-12-07 DIAGNOSIS — Z794 Long term (current) use of insulin: Secondary | ICD-10-CM | POA: Diagnosis not present

## 2016-12-07 LAB — URINALYSIS, MICROSCOPIC (REFLEX): RBC / HPF: NONE SEEN RBC/hpf (ref 0–5)

## 2016-12-07 LAB — URINALYSIS, ROUTINE W REFLEX MICROSCOPIC
Bilirubin Urine: NEGATIVE
Glucose, UA: >=500 mg/dL — AB
HGB URINE DIPSTICK: NEGATIVE
Ketones, ur: NEGATIVE mg/dL
Nitrite: NEGATIVE
PH: 7 (ref 5.0–8.0)
Protein, ur: NEGATIVE mg/dL
Specific Gravity, Urine: 1.026 (ref 1.005–1.030)

## 2016-12-07 LAB — PREGNANCY, URINE: Preg Test, Ur: NEGATIVE

## 2016-12-07 NOTE — ED Triage Notes (Signed)
C/o vaginal d/c x 1 week-NAD-steady gait 

## 2016-12-08 ENCOUNTER — Emergency Department (HOSPITAL_BASED_OUTPATIENT_CLINIC_OR_DEPARTMENT_OTHER)
Admission: EM | Admit: 2016-12-08 | Discharge: 2016-12-08 | Disposition: A | Discharge status: Home / Self Care | Payer: Medicaid Other | Attending: Emergency Medicine | Admitting: Emergency Medicine

## 2016-12-08 DIAGNOSIS — B3731 Acute candidiasis of vulva and vagina: Secondary | ICD-10-CM

## 2016-12-08 DIAGNOSIS — B373 Candidiasis of vulva and vagina: Secondary | ICD-10-CM

## 2016-12-08 LAB — WET PREP, GENITAL
Clue Cells Wet Prep HPF POC: NONE SEEN
Sperm: NONE SEEN
Trich, Wet Prep: NONE SEEN

## 2016-12-08 LAB — GC/CHLAMYDIA PROBE AMP (~~LOC~~) NOT AT ARMC
CHLAMYDIA, DNA PROBE: NEGATIVE
NEISSERIA GONORRHEA: NEGATIVE

## 2016-12-08 MED ORDER — FLUCONAZOLE 50 MG PO TABS
150.0000 mg | ORAL_TABLET | Freq: Once | ORAL | Status: AC
  Administered 2016-12-08: 02:00:00 150 mg via ORAL
  Filled 2016-12-08: qty 1

## 2016-12-08 MED ORDER — IBUPROFEN 400 MG PO TABS
600.0000 mg | ORAL_TABLET | Freq: Once | ORAL | Status: AC
  Administered 2016-12-08: 600 mg via ORAL
  Filled 2016-12-08: qty 1

## 2016-12-08 MED ORDER — FLUCONAZOLE 150 MG PO TABS: 150.0000 mg | ORAL_TABLET | Freq: Once | ORAL | 0 refills | Status: AC

## 2016-12-08 NOTE — ED Provider Notes (Signed)
[facility] Provider Note   CSN: [id number] Arrival date & time: 12/07/16  2233     History   Chief Complaint Chief Complaint  Patient presents with  . Vaginal Discharge    HPI Elaine Vasquez is a 26 y.o. female.  HPI  26 year old female presents with recurrent vaginal discharge. This is she states it feels like a yeast infection. It has been ongoing for about one week. She has been seen here multiple times in the last couple months for the same. The discharge is white, clumpy, and she has vaginal itching. She has been expressing some lower abdominal pain. No nausea or vomiting. No back pain or fevers. She is not concerned about STI and has had the same partner. She has been tested for STI multiple times and his last couple months.  Has not follow up with OB GYN. No dysuria.   Past Medical History:  Diagnosis Date  . Chronic midline thoracic back pain   . Diabetes mellitus without complication (HCC)   . Muscle spasm   . Noncompliance with medications   . Polyneuropathy   . Scoliosis   . Vaginal candida     There are no active problems to display for this patient.   History reviewed. No pertinent surgical history.  OB History    Gravida Para Term Preterm AB Living   1 1           SAB TAB Ectopic Multiple Live Births                   Home Medications    Prior to Admission medications   Medication Sig Start Date End Date Taking? Authorizing Provider  fluconazole (DIFLUCAN) 150 MG tablet Take 1 tablet (150 mg total) by mouth once. If still having a yeast infection 12/11/16 12/11/16  Pricilla LovelessGoldston, Giuliano Preece, MD  glucose blood (ACCU-CHEK GUIDE) test strip Use as instructed 11/08/16   Joy, Shawn C, PA-C  insulin aspart (NOVOLOG) 100 unit/mL injection Inject 10 Units into the skin 3 (three) times daily before meals.    [provider]  insulin glargine (LANTUS) 100 UNIT/ML injection Inject into the skin at bedtime.    [provider]    Family  History No family history on file.  Social History Social History  Substance Use Topics  . Smoking status: Current Every Day Smoker    Types: Cigars  . Smokeless tobacco: Never Used  . Alcohol use Yes     Allergies   Patient has no known allergies.   Review of Systems Review of Systems  Constitutional: Negative for fever.  Gastrointestinal: Positive for abdominal pain. Negative for nausea and vomiting.  Genitourinary: Positive for vaginal discharge. Negative for dysuria, frequency, menstrual problem and vaginal bleeding.  All other systems reviewed and are negative.    Physical Exam Updated Vital Signs BP 125/72 (BP Location: Left Arm)   Pulse 80   Temp 99 F (37.2 C) (Oral)   Resp 16   Ht 5\' 3"  (1.6 m)   Wt 71.7 kg (158 lb)   LMP  (LMP Unknown)   SpO2 100%   BMI 27.99 kg/m   Physical Exam  Constitutional: She is oriented to person, place, and time. She appears well-developed and well-nourished.  HENT:  Head: Normocephalic and atraumatic.  Right Ear: External ear normal.  Left Ear: External ear normal.  Nose: Nose normal.  Eyes: Right eye exhibits no discharge. Left eye exhibits no discharge.  Cardiovascular: Normal rate,  regular rhythm and normal heart sounds.   Pulmonary/Chest: Effort normal and breath sounds normal.  Abdominal: Soft. There is tenderness in the suprapubic area.  Genitourinary: Uterus is not tender. Cervix exhibits no motion tenderness. Vaginal discharge (white, clumpy) found.  Genitourinary Comments: Mild red lesion to cervix, appears irritated.  Neurological: She is alert and oriented to person, place, and time.  Skin: Skin is warm and dry.  Nursing note and vitals reviewed.    ED Treatments / Results  Labs (all labs ordered are listed, but only abnormal results are displayed) Labs Reviewed  WET PREP, GENITAL - Abnormal; Notable for the following:       Result Value   Yeast Wet Prep HPF POC PRESENT (*)    WBC, Wet Prep HPF POC MANY  (*)    All other components within normal limits  URINALYSIS, ROUTINE W REFLEX MICROSCOPIC - Abnormal; Notable for the following:    APPearance CLOUDY (*)    Glucose, UA >=500 (*)    Leukocytes, UA SMALL (*)    All other components within normal limits  URINALYSIS, MICROSCOPIC (REFLEX) - Abnormal; Notable for the following:    Bacteria, UA RARE (*)    Squamous Epithelial / LPF 6-30 (*)    All other components within normal limits  PREGNANCY, URINE  GC/CHLAMYDIA PROBE AMP ([place]) NOT AT [facility]    EKG  EKG Interpretation None       Radiology No results found.  Procedures Procedures (including critical care time)  Medications Ordered in ED Medications  ibuprofen (ADVIL,MOTRIN) tablet 600 mg (600 mg Oral Given 12/08/16 0104)  fluconazole (DIFLUCAN) tablet 150 mg (150 mg Oral Given 12/08/16 0151)     Initial Impression / Assessment and Plan / ED Course  I have reviewed the triage vital signs and the nursing notes.  Pertinent labs & imaging results that were available during my care of the patient were reviewed by me and considered in my medical decision making (see chart for details).     Exam is consistent with candidal vulvovaginitis. Not consistent with PID. She is overall well appearing. This could be due to her diabetes. No recent antibiotics. She states her glucoses run from 100-200 recently. At this point, I think she needs to follow-up with an OB/GYN which has been stressed to her in the past. Discussed the importance of this as well as return precautions. I do not think treatment for possible STI is needed at this time given multiple prior similar visits with multiple prior negative testing. Given Diflucan here and a prescription for 3 days from now if still having symptoms.  Final Clinical Impressions(s) / ED Diagnoses   Final diagnoses:  Candidal vulvovaginitis    New Prescriptions New Prescriptions   FLUCONAZOLE (DIFLUCAN) 150 MG TABLET    Take 1 tablet  (150 mg total) by mouth once. If still having a yeast infection     Pricilla LovelessGoldston, Teasia Zapf, MD 12/08/16 [phone]

## 2016-12-15 ENCOUNTER — Encounter (HOSPITAL_BASED_OUTPATIENT_CLINIC_OR_DEPARTMENT_OTHER): Payer: Self-pay | Admitting: Emergency Medicine

## 2016-12-15 DIAGNOSIS — F17290 Nicotine dependence, other tobacco product, uncomplicated: Secondary | ICD-10-CM | POA: Insufficient documentation

## 2016-12-15 DIAGNOSIS — Z794 Long term (current) use of insulin: Secondary | ICD-10-CM | POA: Diagnosis not present

## 2016-12-15 DIAGNOSIS — E119 Type 2 diabetes mellitus without complications: Secondary | ICD-10-CM | POA: Insufficient documentation

## 2016-12-15 DIAGNOSIS — N898 Other specified noninflammatory disorders of vagina: Secondary | ICD-10-CM | POA: Insufficient documentation

## 2016-12-15 DIAGNOSIS — N390 Urinary tract infection, site not specified: Secondary | ICD-10-CM | POA: Insufficient documentation

## 2016-12-15 LAB — CBG MONITORING, ED: GLUCOSE-CAPILLARY: 273 mg/dL — AB (ref 65–99)

## 2016-12-15 NOTE — ED Triage Notes (Signed)
Pt states she is irritated in her vaginal area with discharge and wants to be checked for std/HIV. Unprotected intercourse last week. Also wants cbg states she's a diabetic. Denies dysuria. Has dark urine no odor.

## 2016-12-16 ENCOUNTER — Emergency Department (HOSPITAL_BASED_OUTPATIENT_CLINIC_OR_DEPARTMENT_OTHER)
Admission: EM | Admit: 2016-12-16 | Discharge: 2016-12-16 | Disposition: A | Discharge status: Home / Self Care | Payer: Medicaid Other | Attending: Emergency Medicine | Admitting: Emergency Medicine

## 2016-12-16 DIAGNOSIS — N390 Urinary tract infection, site not specified: Secondary | ICD-10-CM

## 2016-12-16 DIAGNOSIS — N898 Other specified noninflammatory disorders of vagina: Secondary | ICD-10-CM

## 2016-12-16 LAB — WET PREP, GENITAL
Clue Cells Wet Prep HPF POC: NONE SEEN
Sperm: NONE SEEN
Trich, Wet Prep: NONE SEEN
Yeast Wet Prep HPF POC: NONE SEEN

## 2016-12-16 LAB — URINALYSIS, ROUTINE W REFLEX MICROSCOPIC
Bilirubin Urine: NEGATIVE
Hgb urine dipstick: NEGATIVE
Ketones, ur: NEGATIVE mg/dL
Nitrite: NEGATIVE
PROTEIN: NEGATIVE mg/dL
SPECIFIC GRAVITY, URINE: 1.035 — AB (ref 1.005–1.030)
pH: 5.5 (ref 5.0–8.0)

## 2016-12-16 LAB — URINALYSIS, MICROSCOPIC (REFLEX)

## 2016-12-16 LAB — PREGNANCY, URINE: Preg Test, Ur: NEGATIVE

## 2016-12-16 MED ORDER — CEPHALEXIN 500 MG PO CAPS: 500.0000 mg | ORAL_CAPSULE | Freq: Two times a day (BID) | ORAL | 0 refills | Status: DC

## 2016-12-16 MED ORDER — CEPHALEXIN 250 MG PO CAPS
1000.0000 mg | ORAL_CAPSULE | Freq: Once | ORAL | Status: AC
  Administered 2016-12-16: 1000 mg via ORAL
  Filled 2016-12-16: qty 4

## 2016-12-16 MED ORDER — FLUCONAZOLE 50 MG PO TABS
150.0000 mg | ORAL_TABLET | Freq: Once | ORAL | Status: AC
  Administered 2016-12-16: 150 mg via ORAL
  Filled 2016-12-16: qty 1

## 2016-12-16 MED ORDER — FLUCONAZOLE 150 MG PO TABS: ORAL_TABLET | ORAL | 1 refills | Status: DC

## 2016-12-16 NOTE — ED Provider Notes (Signed)
[facility] Provider Note: Lowella DellJ. Lane Rayburn Mundis, MD, FACEP  CSN: [id number] MRN: [id number] ARRIVAL: 12/15/16 at 2327 ROOM: [facility]   CHIEF COMPLAINT  Vaginal Itching   HISTORY OF PRESENT ILLNESS  12/16/16 1:40 AM Elaine Vasquez is a 26 y.o. female with a two-day history of vaginal irritation and discharge. Symptoms are mild. The discharge is white and does not have a foul odor. She is having some mild suprapubic pain. She denies vaginal bleeding. She denies nausea, vomiting or diarrhea. She has a history of frequent episodes of vulvovaginal candidiasis possibly related to her diabetes. She had protected sexual or course about a week ago.   Past Medical History:  Diagnosis Date  . Chronic midline thoracic back pain   . Diabetes mellitus without complication (HCC)   . Muscle spasm   . Noncompliance with medications   . Polyneuropathy   . Scoliosis   . Vaginal candida     History reviewed. No pertinent surgical history.  No family history on file.  Social History  Substance Use Topics  . Smoking status: Current Every Day Smoker    Types: Cigars  . Smokeless tobacco: Never Used  . Alcohol use Yes    Prior to Admission medications   Medication Sig Start Date End Date Taking? Authorizing Provider  glucose blood (ACCU-CHEK GUIDE) test strip Use as instructed 11/08/16   Joy, Shawn C, PA-C  insulin aspart (NOVOLOG) 100 unit/mL injection Inject 10 Units into the skin 3 (three) times daily before meals.    [provider]  insulin glargine (LANTUS) 100 UNIT/ML injection Inject into the skin at bedtime.    [provider]    Allergies Patient has no known allergies.   REVIEW OF SYSTEMS  Negative except as noted here or in the History of Present Illness.   PHYSICAL EXAMINATION  Initial Vital Signs Blood pressure 110/73, pulse 89, temperature 99 F (37.2 C), temperature source Oral, resp. rate 16, height 5\' 3"  (1.6 m), weight 71.7 kg (158 lb), last  menstrual period 11/22/2016, SpO2 100 %.  Examination General: Well-developed, well-nourished female in no acute distress; appearance consistent with age of record HENT: normocephalic; atraumatic Eyes: pupils equal, round and reactive to light; extraocular muscles intact Neck: supple Heart: regular rate and rhythm Lungs: clear to auscultation bilaterally Abdomen: soft; nondistended; mild suprapubic tenderness; no masses or hepatosplenomegaly; bowel sounds present GU: Normal external genitalia; physiologic appearing vaginal discharge; no vaginal bleeding; no cervical motion tenderness; mild bladder tenderness Extremities: No deformity; full range of motion; pulses normal Neurologic: Awake, alert and oriented; motor function intact in all extremities and symmetric; no facial droop Skin: Warm and dry Psychiatric: Normal mood and affect   RESULTS  Summary of this visit's results, reviewed by myself:   EKG Interpretation  Date/Time:    Ventricular Rate:    PR Interval:    QRS Duration:   QT Interval:    QTC Calculation:   R Axis:     Text Interpretation:        Laboratory Studies: Results for orders placed or performed during the hospital encounter of 12/16/16 (from the past 24 hour(s))  POC CBG, ED     Status: Abnormal   Collection Time: 12/15/16 11:50 PM  Result Value Ref Range   Glucose-Capillary 273 (H) 65 - 99 mg/dL  Urinalysis, Routine w reflex microscopic     Status: Abnormal   Collection Time: 12/16/16 12:20 AM  Result Value Ref Range   Color, Urine YELLOW YELLOW  APPearance CLEAR CLEAR   Specific Gravity, Urine 1.035 (H) 1.005 - 1.030   pH 5.5 5.0 - 8.0   Glucose, UA >=500 (A) NEGATIVE mg/dL   Hgb urine dipstick NEGATIVE NEGATIVE   Bilirubin Urine NEGATIVE NEGATIVE   Ketones, ur NEGATIVE NEGATIVE mg/dL   Protein, ur NEGATIVE NEGATIVE mg/dL   Nitrite NEGATIVE NEGATIVE   Leukocytes, UA SMALL (A) NEGATIVE  Pregnancy, urine     Status: None   Collection Time:  12/16/16 12:20 AM  Result Value Ref Range   Preg Test, Ur NEGATIVE NEGATIVE  Urinalysis, Microscopic (reflex)     Status: Abnormal   Collection Time: 12/16/16 12:20 AM  Result Value Ref Range   RBC / HPF 0-5 0 - 5 RBC/hpf   WBC, UA 6-30 0 - 5 WBC/hpf   Bacteria, UA MANY (A) NONE SEEN   Squamous Epithelial / LPF 6-30 (A) NONE SEEN  Wet prep, genital     Status: Abnormal   Collection Time: 12/16/16 12:31 AM  Result Value Ref Range   Yeast Wet Prep HPF POC NONE SEEN NONE SEEN   Trich, Wet Prep NONE SEEN NONE SEEN   Clue Cells Wet Prep HPF POC NONE SEEN NONE SEEN   WBC, Wet Prep HPF POC MANY (A) NONE SEEN   Sperm NONE SEEN    Imaging Studies: No results found.  ED COURSE  Nursing notes and initial vitals signs, including pulse oximetry, reviewed.  Vitals:   12/15/16 2344 12/15/16 2346  BP: 110/73   Pulse: 89   Resp: 16   Temp: 99 F (37.2 C)   TempSrc: Oral   SpO2: 100%   Weight:  71.7 kg (158 lb)  Height:  5\' 3"  (1.6 m)   We'll treat for urinary tract infection as well as yeast infection. Patient has a history of multiple episodes of vulvovaginal candidiasis.  PROCEDURES    ED DIAGNOSES     ICD-10-CM   1. Vaginal irritation N89.8   2. Lower urinary tract infection, acute N39.0        Shade Rivenbark, MD 12/16/16 [phone]

## 2016-12-16 NOTE — ED Notes (Signed)
Patient transported to X-ray 

## 2016-12-17 LAB — GC/CHLAMYDIA PROBE AMP (~~LOC~~) NOT AT ARMC
CHLAMYDIA, DNA PROBE: NEGATIVE
Neisseria Gonorrhea: NEGATIVE

## 2016-12-17 LAB — URINE CULTURE: CULTURE: NO GROWTH

## 2016-12-17 LAB — RPR: RPR Ser Ql: NONREACTIVE

## 2016-12-17 LAB — HIV ANTIBODY (ROUTINE TESTING W REFLEX): HIV Screen 4th Generation wRfx: NONREACTIVE

## 2016-12-27 ENCOUNTER — Encounter (HOSPITAL_BASED_OUTPATIENT_CLINIC_OR_DEPARTMENT_OTHER): Payer: Self-pay | Admitting: *Deleted

## 2016-12-27 ENCOUNTER — Emergency Department (HOSPITAL_BASED_OUTPATIENT_CLINIC_OR_DEPARTMENT_OTHER)
Admission: EM | Admit: 2016-12-27 | Discharge: 2016-12-27 | Disposition: A | Discharge status: Home / Self Care | Payer: Medicaid Other | Attending: Emergency Medicine | Admitting: Emergency Medicine

## 2016-12-27 DIAGNOSIS — F17290 Nicotine dependence, other tobacco product, uncomplicated: Secondary | ICD-10-CM | POA: Diagnosis not present

## 2016-12-27 DIAGNOSIS — Z760 Encounter for issue of repeat prescription: Secondary | ICD-10-CM

## 2016-12-27 DIAGNOSIS — Z794 Long term (current) use of insulin: Secondary | ICD-10-CM | POA: Diagnosis not present

## 2016-12-27 DIAGNOSIS — E109 Type 1 diabetes mellitus without complications: Secondary | ICD-10-CM | POA: Insufficient documentation

## 2016-12-27 LAB — CBG MONITORING, ED: GLUCOSE-CAPILLARY: 271 mg/dL — AB (ref 65–99)

## 2016-12-27 MED ORDER — INSULIN ASPART 100 UNIT/ML IV SOLN: 10.0000 [IU] | Freq: Three times a day (TID) | INTRAVENOUS | 0 refills | Status: DC

## 2016-12-27 NOTE — ED Provider Notes (Signed)
[facility] Note   CSN: [id number] Arrival date & time: 12/27/16  2118   By signing my name below, I, Elaine Vasquez, attest that this documentation has been prepared under the direction and in the presence of Dail Meece, MD. Electronically signed, Elaine Vasquez, ED Scribe. 12/27/16. 11:35 PM.  History   Chief Complaint Chief Complaint  Patient presents with  . Medication Refill   The history is provided by the patient and medical records. No language interpreter was used.  Medication Refill  Medications/supplies requested:  Insulin Reason for request:  Medications ran out Medications taken before: yes - see home medications   Patient has complete original prescription information: yes     Elaine Vasquez is a 26 y.o. female with h/o T1DM presenting to the Emergency Department asymptomatically after running out of her novalog x 5 days ago. Pt states she has informed her PCP, but she has been unable to F/U d/t financial constraints. Pt also states she has had diabetes x multiple years. No other complaints at this time.   Past Medical History:  Diagnosis Date  . Chronic midline thoracic back pain   . Diabetes mellitus without complication (HCC)   . Muscle spasm   . Noncompliance with medications   . Polyneuropathy   . Scoliosis   . Vaginal candida     There are no active problems to display for this patient.   History reviewed. No pertinent surgical history.  OB History    Gravida Para Term Preterm AB Living   1 1           SAB TAB Ectopic Multiple Live Births                   Home Medications    Prior to Admission medications   Medication Sig Start Date End Date Taking? Authorizing Provider  insulin aspart (NOVOLOG) 100 unit/mL injection Inject 10 Units into the skin 3 (three) times daily before meals.   Yes [provider]  insulin glargine (LANTUS) 100 UNIT/ML injection Inject into the skin at bedtime.   Yes [provider]  cephALEXin (KEFLEX) 500 MG capsule Take 1 capsule (500 mg total) by mouth 2 (two) times daily. 12/16/16   Molpus, John, MD  fluconazole (DIFLUCAN) 150 MG tablet Take one tablet as needed for vaginal yeast infection. 12/16/16   Molpus, John, MD  glucose blood (ACCU-CHEK GUIDE) test strip Use as instructed 11/08/16   Anselm Pancoast, PA-C    Family History No family history on file.  Social History Social History  Substance Use Topics  . Smoking status: Current Every Day Smoker    Types: Cigars  . Smokeless tobacco: Never Used  . Alcohol use Yes     Allergies   Patient has no known allergies.   Review of Systems Review of Systems  Constitutional: Negative for chills and fever.  Respiratory: Negative for shortness of breath.   Cardiovascular: Negative for chest pain.  Gastrointestinal: Negative for nausea and vomiting.  Endocrine: Negative for polydipsia, polyphagia and polyuria.  Skin: Negative for color change and wound.  Neurological: Negative for weakness, numbness and headaches.  All other systems reviewed and are negative.    Physical Exam Updated Vital Signs BP 128/75 (BP Location: Left Arm)   Pulse 80   Temp 97.7 F (36.5 C) (Oral)   Resp 16   Ht 5\' 3"  (1.6 m)   Wt 160 lb (72.6 kg)   LMP 12/21/2016  SpO2 100%   BMI 28.34 kg/m   Physical Exam  Constitutional: She is oriented to person, place, and time. She appears well-developed and well-nourished. No distress.  HENT:  Head: Normocephalic and atraumatic.  Mouth/Throat: Oropharynx is clear and moist and mucous membranes are normal. No oropharyngeal exudate.  No ketones in the breath  Eyes: Pupils are equal, round, and reactive to light. Conjunctivae and EOM are normal.  Neck: Normal range of motion. Neck supple. No JVD present. Carotid bruit is not present.  Cardiovascular: Normal rate, regular rhythm, normal heart sounds and intact distal pulses.   Pulmonary/Chest: Effort normal and breath  sounds normal. No stridor. She has no decreased breath sounds. She has no wheezes. She has no rhonchi. She has no rales.  Abdominal: Soft. Bowel sounds are normal. She exhibits no fluid wave and no mass. There is no tenderness. There is no rebound and no guarding.  Musculoskeletal: Normal range of motion. She exhibits no edema.  Neurological: She is alert and oriented to person, place, and time.  Skin: Skin is warm and dry. Capillary refill takes less than 2 seconds.  Psychiatric: She has a normal mood and affect.  Nursing note and vitals reviewed.    ED Treatments / Results  DIAGNOSTIC STUDIES: Oxygen Saturation is 100% on RA, NL by my interpretation.    COORDINATION OF CARE: 11:32 PM-Discussed next steps with pt. Pt verbalized understanding and is agreeable with the plan. Will Rx medication.   Labs (all labs ordered are listed, but only abnormal results are displayed) Labs Reviewed  CBG MONITORING, ED - Abnormal; Notable for the following:       Result Value   Glucose-Capillary 271 (*)    All other components within normal limits    Procedures Procedures (including critical care time)     Final Clinical Impressions(s) / ED Diagnoses  The patient is very well appearing and has been observed in the ED.  Strict return precautions given for facial swelling, drooling, swelling of the mouth or throat, intractable vomiting, weakness,  intractable abdominal pain, hard or rigid abdomen, inability to pass gas or stool, intractable diarrhea.  inability to tolerate oral liquids or foods, shortness of breath, changes in vision or thinking, chest pain, dyspnea on exertion, weakness or numbness or any concerns. No signs of systemic illness or infection. The patient is nontoxic-appearing on exam and vital signs are within normal limits. He is advised to take tylenol for pain and drink copious liquids and follow up with his PMD in 2 days.  I have reviewed the triage vital signs and the nursing  notes. Pertinent labs &imaging results that were available during my care of the patient were reviewed by me and considered in my medical decision making (see chart for details).  After history, exam, and medical workup I feel the patient has been appropriately medically screened and is safe for discharge home. Pertinent diagnoses were discussed with the patient. Patient was given return precautions.  I personally performed the services described in this documentation, which was scribed in my presence. The recorded information has been reviewed and is accurate.      Emerita Berkemeier, MD 12/28/16 0005

## 2016-12-27 NOTE — ED Triage Notes (Addendum)
Pt states she ran out of Insulin on Thursday and needs a Rx. She called her MD and left a message but she never got a response.

## 2016-12-28 ENCOUNTER — Encounter (HOSPITAL_BASED_OUTPATIENT_CLINIC_OR_DEPARTMENT_OTHER): Payer: Self-pay | Admitting: Emergency Medicine

## 2017-01-27 ENCOUNTER — Telehealth: Payer: Self-pay | Admitting: *Deleted

## 2017-01-27 NOTE — Telephone Encounter (Signed)
Received request for Medical records from [place] [employer], forwarded to [place] for email/scan/SLS 09/13    

## 2017-02-14 ENCOUNTER — Encounter (HOSPITAL_BASED_OUTPATIENT_CLINIC_OR_DEPARTMENT_OTHER): Payer: Self-pay | Admitting: *Deleted

## 2017-02-14 ENCOUNTER — Emergency Department (HOSPITAL_BASED_OUTPATIENT_CLINIC_OR_DEPARTMENT_OTHER)
Admission: EM | Admit: 2017-02-14 | Discharge: 2017-02-15 | Disposition: A | Discharge status: Home / Self Care | Payer: Medicaid Other | Attending: Emergency Medicine | Admitting: Emergency Medicine

## 2017-02-14 DIAGNOSIS — E1165 Type 2 diabetes mellitus with hyperglycemia: Secondary | ICD-10-CM | POA: Insufficient documentation

## 2017-02-14 DIAGNOSIS — R739 Hyperglycemia, unspecified: Secondary | ICD-10-CM

## 2017-02-14 DIAGNOSIS — F17290 Nicotine dependence, other tobacco product, uncomplicated: Secondary | ICD-10-CM | POA: Insufficient documentation

## 2017-02-14 DIAGNOSIS — Z794 Long term (current) use of insulin: Secondary | ICD-10-CM | POA: Diagnosis not present

## 2017-02-14 LAB — CBG MONITORING, ED: Glucose-Capillary: 472 mg/dL — ABNORMAL HIGH (ref 65–99)

## 2017-02-14 LAB — PREGNANCY, URINE: Preg Test, Ur: NEGATIVE

## 2017-02-14 NOTE — ED Provider Notes (Signed)
[facility] Provider Note: Lowella Dell, MD, FACEP  CSN: [id number] MRN: [id number] ARRIVAL: 02/14/17 at 2342 ROOM: [facility]07/[facility]07   CHIEF COMPLAINT  Hyperglycemia   HISTORY OF PRESENT ILLNESS  02/14/17 11:59 PM Elaine Vasquez is a 26 y.o. female with a history of diabetes, insulin-dependent. She is here with polyuria and polydipsia since about noon. She has not taken her blood sugar but suspects it is elevated given her symptoms. She denies associated nausea, vomiting or shortness of breath. She has never been in diabetic ketoacidosis. On arrival her sugar was noted to be 472. She states she has been compliant with her insulin.   Past Medical History:  Diagnosis Date  . Chronic midline thoracic back pain   . Diabetes mellitus without complication (HCC)   . Muscle spasm   . Noncompliance with medications   . Polyneuropathy   . Scoliosis   . Vaginal candida     History reviewed. No pertinent surgical history.  History reviewed. No pertinent family history.  Social History  Substance Use Topics  . Smoking status: Current Every Day Smoker    Types: Cigars  . Smokeless tobacco: Never Used  . Alcohol use Yes    Prior to Admission medications   Medication Sig Start Date End Date Taking? Authorizing Provider  cephALEXin (KEFLEX) 500 MG capsule Take 1 capsule (500 mg total) by mouth 2 (two) times daily. 12/16/16   Kenn Rekowski, MD  fluconazole (DIFLUCAN) 150 MG tablet Take one tablet as needed for vaginal yeast infection. 12/16/16   Ovadia Lopp, MD  glucose blood (ACCU-CHEK GUIDE) test strip Use as instructed 11/08/16   Joy, Shawn C, PA-C  insulin aspart (NOVOLOG) 100 unit/mL injection Inject 10 Units into the skin 3 (three) times daily before meals. 12/27/16   Palumbo, April, MD  insulin glargine (LANTUS) 100 UNIT/ML injection Inject into the skin at bedtime.    [provider]    Allergies Patient has no known allergies.   REVIEW OF SYSTEMS  Negative  except as noted here or in the History of Present Illness.   PHYSICAL EXAMINATION  Initial Vital Signs Blood pressure 123/73, pulse (!) 101, temperature 98.3 F (36.8 C), temperature source Oral, resp. rate 18, height  (1.6 m), weight 67.6 kg (149 lb), last menstrual period 01/20/2017, SpO2 100 %.  Examination General: Well-developed, well-nourished female in no acute distress; appearance consistent with age of record HENT: normocephalic; atraumatic; breath nonketotic Eyes: pupils equal, round and reactive to light; extraocular muscles intact Neck: supple Heart: regular rate and rhythm Lungs: clear to auscultation bilaterally Abdomen: soft; nondistended; nontender; bowel sounds present Extremities: No deformity; full range of motion; pulses normal Neurologic: Awake, alert and oriented; motor function intact in all extremities and symmetric; no facial droop Skin: Warm and dry Psychiatric: Normal mood and affect   RESULTS  Summary of this visit's results, reviewed by myself:   EKG Interpretation  Date/Time:    Ventricular Rate:    PR Interval:    QRS Duration:   QT Interval:    QTC Calculation:   R Axis:     Text Interpretation:        Laboratory Studies: Results for orders placed or performed during the hospital encounter of 02/14/17 (from the past 24 hour(s))  Urinalysis, Routine w reflex microscopic     Status: Abnormal   Collection Time: 02/14/17 11:45 PM  Result Value Ref Range   Color, Urine YELLOW YELLOW   APPearance CLEAR CLEAR   Specific  Gravity, Urine <1.005 (L) 1.005 - 1.030   pH 7.0 5.0 - 8.0   Glucose, UA >=500 (A) NEGATIVE mg/dL   Hgb urine dipstick NEGATIVE NEGATIVE   Bilirubin Urine NEGATIVE NEGATIVE   Ketones, ur NEGATIVE NEGATIVE mg/dL   Protein, ur NEGATIVE NEGATIVE mg/dL   Nitrite NEGATIVE NEGATIVE   Leukocytes, UA NEGATIVE NEGATIVE  Pregnancy, urine     Status: None   Collection Time: 02/14/17 11:45 PM  Result Value Ref Range   Preg  Test, Ur NEGATIVE NEGATIVE  Urinalysis, Microscopic (reflex)     Status: Abnormal   Collection Time: 02/14/17 11:45 PM  Result Value Ref Range   RBC / HPF 0-5 0 - 5 RBC/hpf   WBC, UA NONE SEEN 0 - 5 WBC/hpf   Bacteria, UA RARE (A) NONE SEEN   Squamous Epithelial / LPF 0-5 (A) NONE SEEN  POC CBG, ED     Status: Abnormal   Collection Time: 02/14/17 11:52 PM  Result Value Ref Range   Glucose-Capillary 472 (H) 65 - 99 mg/dL  CBC with Differential/Platelet     Status: None   Collection Time: 02/15/17 12:12 AM  Result Value Ref Range   WBC 8.0 4.0 - 10.5 K/uL   RBC 4.39 3.87 - 5.11 MIL/uL   Hemoglobin 13.6 12.0 - 15.0 g/dL   HCT 16.1 09.6 - 04.5 %   MCV 87.5 78.0 - 100.0 fL   MCH 31.0 26.0 - 34.0 pg   MCHC 35.4 30.0 - 36.0 g/dL   RDW 40.9 81.1 - 91.4 %   Platelets 210 150 - 400 K/uL   Neutrophils Relative % 49 %   Neutro Abs 3.9 1.7 - 7.7 K/uL   Lymphocytes Relative 41 %   Lymphs Abs 3.3 0.7 - 4.0 K/uL   Monocytes Relative 7 %   Monocytes Absolute 0.6 0.1 - 1.0 K/uL   Eosinophils Relative 3 %   Eosinophils Absolute 0.2 0.0 - 0.7 K/uL   Basophils Relative 0 %   Basophils Absolute 0.0 0.0 - 0.1 K/uL  Basic metabolic panel     Status: Abnormal   Collection Time: 02/15/17 12:12 AM  Result Value Ref Range   Sodium 130 (L) 135 - 145 mmol/L   Potassium 3.9 3.5 - 5.1 mmol/L   Chloride 96 (L) 101 - 111 mmol/L   CO2 27 22 - 32 mmol/L   Glucose, Bld 403 (H) 65 - 99 mg/dL   BUN 13 6 - 20 mg/dL   Creatinine, Ser 7.82 0.44 - 1.00 mg/dL   Calcium 8.8 (L) 8.9 - 10.3 mg/dL   GFR calc non Af Amer >60 >60 mL/min   GFR calc Af Amer >60 >60 mL/min   Anion gap 7 5 - 15  I-Stat venous blood gas, ED     Status: Abnormal   Collection Time: 02/15/17 12:18 AM  Result Value Ref Range   pH, Ven 7.373 7.250 - 7.430   pCO2, Ven 52.5 44.0 - 60.0 mmHg   pO2, Ven 22.0 (LL) 32.0 - 45.0 mmHg   Bicarbonate 30.6 (H) 20.0 - 28.0 mmol/L   TCO2 32 22 - 32 mmol/L   O2 Saturation 35.0 %   Acid-Base Excess  4.0 (H) 0.0 - 2.0 mmol/L   Patient temperature 98.3 F    Collection site IV START    Drawn by Nurse    Sample type VENOUS    Comment NOTIFIED PHYSICIAN   CBG monitoring, ED     Status: Abnormal   Collection Time:  02/15/17  2:13 AM  Result Value Ref Range   Glucose-Capillary 278 (H) 65 - 99 mg/dL   Imaging Studies: No results found.  ED COURSE  Nursing notes and initial vitals signs, including pulse oximetry, reviewed.  Vitals:   02/14/17 2349 02/14/17 2351 02/15/17 0210  BP:  123/73 (!) 104/56  Pulse:  (!) 101 80  Resp:  18 16  Temp:  98.3 F (36.8 C)   TempSrc:  Oral   SpO2:  100% 100%  Weight: 67.6 kg (149 lb)    Height:  (1.6 m)     2:55 AM Sugar down to 278 after subcutaneous insulin and IV fluid bolus. Patient feeling better. No evidence of diabetic ketoacidosis.  PROCEDURES    ED DIAGNOSES     ICD-10-CM   1. Hyperglycemia R73.9        Jaculin Rasmus, Jonny Ruiz, MD 02/15/17 [phone]

## 2017-02-14 NOTE — ED Triage Notes (Signed)
Pt reports increased Blood sugar x 1 day

## 2017-02-15 LAB — I-STAT VENOUS BLOOD GAS, ED
Acid-Base Excess: 4 mmol/L — ABNORMAL HIGH (ref 0.0–2.0)
BICARBONATE: 30.6 mmol/L — AB (ref 20.0–28.0)
O2 SAT: 35 %
PCO2 VEN: 52.5 mmHg (ref 44.0–60.0)
PO2 VEN: 22 mmHg — AB (ref 32.0–45.0)
Patient temperature: 98.3
TCO2: 32 mmol/L (ref 22–32)
pH, Ven: 7.373 (ref 7.250–7.430)

## 2017-02-15 LAB — URINALYSIS, ROUTINE W REFLEX MICROSCOPIC
BILIRUBIN URINE: NEGATIVE
Glucose, UA: >=500 mg/dL — AB
Hgb urine dipstick: NEGATIVE
KETONES UR: NEGATIVE mg/dL
Leukocytes, UA: NEGATIVE
NITRITE: NEGATIVE
Protein, ur: NEGATIVE mg/dL
pH: 7 (ref 5.0–8.0)

## 2017-02-15 LAB — CBC WITH DIFFERENTIAL/PLATELET
BASOS ABS: 0 10*3/uL (ref 0.0–0.1)
BASOS PCT: 0 %
EOS ABS: 0.2 10*3/uL (ref 0.0–0.7)
Eosinophils Relative: 3 %
HCT: 38.4 % (ref 36.0–46.0)
HEMOGLOBIN: 13.6 g/dL (ref 12.0–15.0)
Lymphocytes Relative: 41 %
Lymphs Abs: 3.3 10*3/uL (ref 0.7–4.0)
MCH: 31 pg (ref 26.0–34.0)
MCHC: 35.4 g/dL (ref 30.0–36.0)
MCV: 87.5 fL (ref 78.0–100.0)
Monocytes Absolute: 0.6 10*3/uL (ref 0.1–1.0)
Monocytes Relative: 7 %
NEUTROS PCT: 49 %
Neutro Abs: 3.9 10*3/uL (ref 1.7–7.7)
Platelets: 210 10*3/uL (ref 150–400)
RBC: 4.39 MIL/uL (ref 3.87–5.11)
RDW: 11.9 % (ref 11.5–15.5)
WBC: 8 10*3/uL (ref 4.0–10.5)

## 2017-02-15 LAB — URINALYSIS, MICROSCOPIC (REFLEX): WBC UA: NONE SEEN WBC/hpf (ref 0–5)

## 2017-02-15 LAB — BASIC METABOLIC PANEL
Anion gap: 7 (ref 5–15)
BUN: 13 mg/dL (ref 6–20)
CHLORIDE: 96 mmol/L — AB (ref 101–111)
CO2: 27 mmol/L (ref 22–32)
Calcium: 8.8 mg/dL — ABNORMAL LOW (ref 8.9–10.3)
Creatinine, Ser: 0.79 mg/dL (ref 0.44–1.00)
GFR calc non Af Amer: >60 mL/min (ref >60)
Glucose, Bld: 403 mg/dL — ABNORMAL HIGH (ref 65–99)
POTASSIUM: 3.9 mmol/L (ref 3.5–5.1)
SODIUM: 130 mmol/L — AB (ref 135–145)

## 2017-02-15 LAB — CBG MONITORING, ED: GLUCOSE-CAPILLARY: 278 mg/dL — AB (ref 65–99)

## 2017-02-15 MED ORDER — INSULIN REGULAR HUMAN 100 UNIT/ML IJ SOLN
10.0000 [IU] | Freq: Once | INTRAMUSCULAR | Status: AC
  Administered 2017-02-15: 10 [IU] via SUBCUTANEOUS
  Filled 2017-02-15: qty 1

## 2017-02-15 MED ORDER — INSULIN REGULAR HUMAN 100 UNIT/ML IJ SOLN
INTRAMUSCULAR | Status: DC
  Filled 2017-02-15 (×2): qty 1

## 2017-02-15 MED ORDER — SODIUM CHLORIDE 0.9 % IV BOLUS (SEPSIS)
1000.0000 mL | Freq: Once | INTRAVENOUS | Status: AC
  Administered 2017-02-15: 1000 mL via INTRAVENOUS

## 2017-02-15 MED ORDER — INSULIN ASPART 100 UNIT/ML IV SOLN: 10.0000 [IU] | Freq: Three times a day (TID) | INTRAVENOUS | 0 refills | Status: AC

## 2017-02-15 NOTE — ED Notes (Signed)
Pt verbalizes understanding of d/c instructions and denies any further needs at this time. 

## 2017-02-17 LAB — BLOOD GAS, VENOUS

## 2019-05-29 ENCOUNTER — Emergency Department (HOSPITAL_BASED_OUTPATIENT_CLINIC_OR_DEPARTMENT_OTHER)
Admission: EM | Admit: 2019-05-29 | Discharge: 2019-05-29 | Disposition: A | Discharge status: Home / Self Care | Payer: Medicaid Other | Attending: Emergency Medicine | Admitting: Emergency Medicine

## 2019-05-29 ENCOUNTER — Encounter (HOSPITAL_BASED_OUTPATIENT_CLINIC_OR_DEPARTMENT_OTHER): Payer: Self-pay | Admitting: *Deleted

## 2019-05-29 ENCOUNTER — Other Ambulatory Visit: Payer: Self-pay

## 2019-05-29 ENCOUNTER — Emergency Department (HOSPITAL_BASED_OUTPATIENT_CLINIC_OR_DEPARTMENT_OTHER): Payer: Medicaid Other

## 2019-05-29 DIAGNOSIS — Z79899 Other long term (current) drug therapy: Secondary | ICD-10-CM | POA: Insufficient documentation

## 2019-05-29 DIAGNOSIS — F17290 Nicotine dependence, other tobacco product, uncomplicated: Secondary | ICD-10-CM | POA: Insufficient documentation

## 2019-05-29 DIAGNOSIS — Y92410 Unspecified street and highway as the place of occurrence of the external cause: Secondary | ICD-10-CM | POA: Insufficient documentation

## 2019-05-29 DIAGNOSIS — R110 Nausea: Secondary | ICD-10-CM | POA: Diagnosis not present

## 2019-05-29 DIAGNOSIS — R109 Unspecified abdominal pain: Secondary | ICD-10-CM | POA: Diagnosis present

## 2019-05-29 DIAGNOSIS — E119 Type 2 diabetes mellitus without complications: Secondary | ICD-10-CM | POA: Diagnosis not present

## 2019-05-29 DIAGNOSIS — Y9389 Activity, other specified: Secondary | ICD-10-CM | POA: Insufficient documentation

## 2019-05-29 DIAGNOSIS — Y999 Unspecified external cause status: Secondary | ICD-10-CM | POA: Diagnosis not present

## 2019-05-29 DIAGNOSIS — M542 Cervicalgia: Secondary | ICD-10-CM | POA: Insufficient documentation

## 2019-05-29 DIAGNOSIS — Z91040 Latex allergy status: Secondary | ICD-10-CM | POA: Insufficient documentation

## 2019-05-29 DIAGNOSIS — R1084 Generalized abdominal pain: Secondary | ICD-10-CM | POA: Insufficient documentation

## 2019-05-29 DIAGNOSIS — M549 Dorsalgia, unspecified: Secondary | ICD-10-CM | POA: Diagnosis not present

## 2019-05-29 LAB — CBC WITH DIFFERENTIAL/PLATELET
Abs Immature Granulocytes: 0.02 10*3/uL (ref 0.00–0.07)
Basophils Absolute: 0 10*3/uL (ref 0.0–0.1)
Basophils Relative: 0 %
Eosinophils Absolute: 0.1 10*3/uL (ref 0.0–0.5)
Eosinophils Relative: 1 %
HCT: 39.5 % (ref 36.0–46.0)
Hemoglobin: 12.9 g/dL (ref 12.0–15.0)
Immature Granulocytes: 0 %
Lymphocytes Relative: 24 %
Lymphs Abs: 2.2 10*3/uL (ref 0.7–4.0)
MCH: 29.3 pg (ref 26.0–34.0)
MCHC: 32.7 g/dL (ref 30.0–36.0)
MCV: 89.6 fL (ref 80.0–100.0)
Monocytes Absolute: 0.5 10*3/uL (ref 0.1–1.0)
Monocytes Relative: 5 %
Neutro Abs: 6.4 10*3/uL (ref 1.7–7.7)
Neutrophils Relative %: 70 %
Platelets: 327 10*3/uL (ref 150–400)
RBC: 4.41 MIL/uL (ref 3.87–5.11)
RDW: 13 % (ref 11.5–15.5)
WBC: 9.2 10*3/uL (ref 4.0–10.5)
nRBC: 0 % (ref 0.0–0.2)

## 2019-05-29 LAB — URINALYSIS, ROUTINE W REFLEX MICROSCOPIC
Bilirubin Urine: NEGATIVE
Glucose, UA: >=500 mg/dL — AB
Ketones, ur: NEGATIVE mg/dL
Leukocytes,Ua: NEGATIVE
Nitrite: NEGATIVE
Protein, ur: NEGATIVE mg/dL
Specific Gravity, Urine: 1.02 (ref 1.005–1.030)
pH: 8 (ref 5.0–8.0)

## 2019-05-29 LAB — COMPREHENSIVE METABOLIC PANEL
ALT: 11 U/L (ref 0–44)
AST: 13 U/L — ABNORMAL LOW (ref 15–41)
Albumin: 4.2 g/dL (ref 3.5–5.0)
Alkaline Phosphatase: 63 U/L (ref 38–126)
Anion gap: 11 (ref 5–15)
BUN: 11 mg/dL (ref 6–20)
CO2: 27 mmol/L (ref 22–32)
Calcium: 8.6 mg/dL — ABNORMAL LOW (ref 8.9–10.3)
Chloride: 98 mmol/L (ref 98–111)
Creatinine, Ser: 0.72 mg/dL (ref 0.44–1.00)
GFR calc Af Amer: >60 mL/min (ref >60)
GFR calc non Af Amer: >60 mL/min (ref >60)
Glucose, Bld: 325 mg/dL — ABNORMAL HIGH (ref 70–99)
Potassium: 3.7 mmol/L (ref 3.5–5.1)
Sodium: 136 mmol/L (ref 135–145)
Total Bilirubin: 0.5 mg/dL (ref 0.3–1.2)
Total Protein: 7.8 g/dL (ref 6.5–8.1)

## 2019-05-29 LAB — URINALYSIS, MICROSCOPIC (REFLEX)
RBC / HPF: >50 RBC/hpf (ref 0–5)
WBC, UA: NONE SEEN WBC/hpf (ref 0–5)

## 2019-05-29 LAB — CBG MONITORING, ED
Glucose-Capillary: 282 mg/dL — ABNORMAL HIGH (ref 70–99)
Glucose-Capillary: 192 mg/dL — ABNORMAL HIGH (ref 70–99)

## 2019-05-29 LAB — PREGNANCY, URINE: Preg Test, Ur: NEGATIVE

## 2019-05-29 MED ORDER — IOHEXOL 300 MG/ML  SOLN
100.0000 mL | Freq: Once | INTRAMUSCULAR | Status: AC | PRN
  Administered 2019-05-29: 16:00:00 100 mL via INTRAVENOUS

## 2019-05-29 MED ORDER — MORPHINE SULFATE (PF) 2 MG/ML IV SOLN
2.0000 mg | Freq: Once | INTRAVENOUS | Status: AC
  Administered 2019-05-29: 14:00:00 2 mg via INTRAVENOUS
  Filled 2019-05-29: qty 1

## 2019-05-29 MED ORDER — DEXCOM G6 SENSOR MISC: 1.0000 | Freq: Once | 0 refills | Status: AC

## 2019-05-29 MED ORDER — INSULIN ASPART 100 UNIT/ML ~~LOC~~ SOLN: SUBCUTANEOUS | 0 refills | Status: AC

## 2019-05-29 MED ORDER — CYCLOBENZAPRINE HCL 10 MG PO TABS: 10.0000 mg | ORAL_TABLET | Freq: Two times a day (BID) | ORAL | 0 refills | Status: AC | PRN

## 2019-05-29 MED ORDER — ONDANSETRON HCL 4 MG/2ML IJ SOLN
4.0000 mg | Freq: Once | INTRAMUSCULAR | Status: AC
  Administered 2019-05-29: 14:00:00 4 mg via INTRAVENOUS
  Filled 2019-05-29: qty 2

## 2019-05-29 MED ORDER — OMNIPOD DASH PODS (GEN 4) MISC: 1.0000 | 0 refills | Status: AC

## 2019-05-29 MED FILL — OMNIPOD DASH 5 PACK MISC: 15 days supply | Qty: 5 | Fill #0

## 2019-05-29 MED FILL — CYCLOBENZAPRINE HCL 10 MG T: 10 | 10 days supply | Qty: 20 | Fill #0

## 2019-05-29 MED FILL — DEXCOM G6 SENSOR MISC: 30 days supply | Qty: 3 | Fill #0

## 2019-05-29 NOTE — ED Notes (Signed)
Delay in obtaining imaging due to PA consulting with pharmacy regarding removal of glucose monitoring device and insulin pump prior to CT.

## 2019-05-29 NOTE — Discharge Instructions (Addendum)
Please read instructions below. Apply ice to your areas of pain for 20 minutes at a time. You can take 600 mg of Advil/ibuprofen every 6 hours as needed for pain. You can take flexeril every 12 hours as needed for muscle spasm. Be aware this medication can make you drowsy. Schedule an appointment with your primary care provider to follow up on your visit today. Return to the ER for severely worsening abdominal pain, severe headache, vision changes, if new numbness or tingling in your arms or legs, inability to urinate, inability to hold your bowels, or weakness in your extremities.
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
## 2019-05-31 ENCOUNTER — Other Ambulatory Visit: Payer: Self-pay

## 2019-05-31 ENCOUNTER — Encounter (HOSPITAL_BASED_OUTPATIENT_CLINIC_OR_DEPARTMENT_OTHER): Payer: Self-pay

## 2019-05-31 ENCOUNTER — Emergency Department (HOSPITAL_BASED_OUTPATIENT_CLINIC_OR_DEPARTMENT_OTHER): Payer: Medicaid Other

## 2019-05-31 ENCOUNTER — Emergency Department (HOSPITAL_BASED_OUTPATIENT_CLINIC_OR_DEPARTMENT_OTHER)
Admission: EM | Admit: 2019-05-31 | Discharge: 2019-05-31 | Disposition: A | Discharge status: Home / Self Care | Payer: Medicaid Other | Attending: Emergency Medicine | Admitting: Emergency Medicine

## 2019-05-31 DIAGNOSIS — Z87891 Personal history of nicotine dependence: Secondary | ICD-10-CM | POA: Insufficient documentation

## 2019-05-31 DIAGNOSIS — Y929 Unspecified place or not applicable: Secondary | ICD-10-CM | POA: Diagnosis not present

## 2019-05-31 DIAGNOSIS — Y939 Activity, unspecified: Secondary | ICD-10-CM | POA: Diagnosis not present

## 2019-05-31 DIAGNOSIS — S134XXA Sprain of ligaments of cervical spine, initial encounter: Secondary | ICD-10-CM | POA: Diagnosis not present

## 2019-05-31 DIAGNOSIS — Z794 Long term (current) use of insulin: Secondary | ICD-10-CM | POA: Insufficient documentation

## 2019-05-31 DIAGNOSIS — F419 Anxiety disorder, unspecified: Secondary | ICD-10-CM

## 2019-05-31 DIAGNOSIS — G44309 Post-traumatic headache, unspecified, not intractable: Secondary | ICD-10-CM | POA: Diagnosis not present

## 2019-05-31 DIAGNOSIS — S199XXA Unspecified injury of neck, initial encounter: Secondary | ICD-10-CM | POA: Diagnosis present

## 2019-05-31 DIAGNOSIS — T148XXA Other injury of unspecified body region, initial encounter: Secondary | ICD-10-CM

## 2019-05-31 DIAGNOSIS — Z91040 Latex allergy status: Secondary | ICD-10-CM | POA: Insufficient documentation

## 2019-05-31 DIAGNOSIS — Y999 Unspecified external cause status: Secondary | ICD-10-CM | POA: Insufficient documentation

## 2019-05-31 DIAGNOSIS — R002 Palpitations: Secondary | ICD-10-CM | POA: Diagnosis not present

## 2019-05-31 DIAGNOSIS — E119 Type 2 diabetes mellitus without complications: Secondary | ICD-10-CM | POA: Insufficient documentation

## 2019-05-31 LAB — PREGNANCY, URINE: Preg Test, Ur: NEGATIVE

## 2019-05-31 LAB — CBG MONITORING, ED: Glucose-Capillary: 270 mg/dL — ABNORMAL HIGH (ref 70–99)

## 2019-05-31 MED ORDER — NAPROXEN 500 MG PO TABS: 500.0000 mg | ORAL_TABLET | Freq: Two times a day (BID) | ORAL | 0 refills | Status: AC

## 2019-05-31 MED ORDER — HYDROCODONE-ACETAMINOPHEN 5-325 MG PO TABS
1.0000 | ORAL_TABLET | Freq: Once | ORAL | Status: AC
  Administered 2019-05-31: 17:00:00 1 via ORAL
  Filled 2019-05-31: qty 1

## 2019-05-31 MED ORDER — NAPROXEN 250 MG PO TABS
500.0000 mg | ORAL_TABLET | Freq: Once | ORAL | Status: AC
  Administered 2019-05-31: 16:00:00 500 mg via ORAL
  Filled 2019-05-31: qty 2

## 2019-05-31 MED ORDER — HYDROXYZINE HCL 25 MG PO TABS: 25.0000 mg | ORAL_TABLET | Freq: Three times a day (TID) | ORAL | 0 refills | Status: AC | PRN

## 2019-05-31 MED ORDER — LORAZEPAM 1 MG PO TABS
1.0000 mg | ORAL_TABLET | Freq: Once | ORAL | Status: AC
  Administered 2019-05-31: 16:00:00 1 mg via ORAL
  Filled 2019-05-31: qty 1

## 2019-05-31 MED FILL — NAPROXEN 500 MG TABLET: 500 | 15 days supply | Qty: 30 | Fill #0

## 2019-05-31 MED FILL — hydrOXYzine HCL 25 MG TABS: 25 | 7 days supply | Qty: 21 | Fill #0
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
## 2019-06-06 ENCOUNTER — Emergency Department (HOSPITAL_BASED_OUTPATIENT_CLINIC_OR_DEPARTMENT_OTHER)
Admission: EM | Admit: 2019-06-06 | Discharge: 2019-06-07 | Disposition: A | Discharge status: Home / Self Care | Payer: Medicaid Other | Attending: Emergency Medicine | Admitting: Emergency Medicine

## 2019-06-06 ENCOUNTER — Emergency Department (HOSPITAL_BASED_OUTPATIENT_CLINIC_OR_DEPARTMENT_OTHER): Payer: Medicaid Other

## 2019-06-06 ENCOUNTER — Encounter (HOSPITAL_BASED_OUTPATIENT_CLINIC_OR_DEPARTMENT_OTHER): Payer: Self-pay | Admitting: Emergency Medicine

## 2019-06-06 ENCOUNTER — Other Ambulatory Visit: Payer: Self-pay

## 2019-06-06 DIAGNOSIS — Z91040 Latex allergy status: Secondary | ICD-10-CM | POA: Diagnosis not present

## 2019-06-06 DIAGNOSIS — R202 Paresthesia of skin: Secondary | ICD-10-CM | POA: Insufficient documentation

## 2019-06-06 DIAGNOSIS — Y92410 Unspecified street and highway as the place of occurrence of the external cause: Secondary | ICD-10-CM | POA: Diagnosis not present

## 2019-06-06 DIAGNOSIS — S161XXA Strain of muscle, fascia and tendon at neck level, initial encounter: Secondary | ICD-10-CM | POA: Diagnosis not present

## 2019-06-06 DIAGNOSIS — S0990XA Unspecified injury of head, initial encounter: Secondary | ICD-10-CM | POA: Diagnosis not present

## 2019-06-06 DIAGNOSIS — S29019A Strain of muscle and tendon of unspecified wall of thorax, initial encounter: Secondary | ICD-10-CM | POA: Diagnosis not present

## 2019-06-06 DIAGNOSIS — R200 Anesthesia of skin: Secondary | ICD-10-CM | POA: Diagnosis not present

## 2019-06-06 DIAGNOSIS — Z794 Long term (current) use of insulin: Secondary | ICD-10-CM | POA: Diagnosis not present

## 2019-06-06 DIAGNOSIS — Y9389 Activity, other specified: Secondary | ICD-10-CM | POA: Insufficient documentation

## 2019-06-06 DIAGNOSIS — R410 Disorientation, unspecified: Secondary | ICD-10-CM | POA: Insufficient documentation

## 2019-06-06 DIAGNOSIS — Z87891 Personal history of nicotine dependence: Secondary | ICD-10-CM | POA: Diagnosis not present

## 2019-06-06 DIAGNOSIS — E1140 Type 2 diabetes mellitus with diabetic neuropathy, unspecified: Secondary | ICD-10-CM | POA: Diagnosis not present

## 2019-06-06 DIAGNOSIS — Y998 Other external cause status: Secondary | ICD-10-CM | POA: Diagnosis not present

## 2019-06-06 LAB — CBG MONITORING, ED: Glucose-Capillary: 287 mg/dL — ABNORMAL HIGH (ref 70–99)

## 2019-06-06 MED ORDER — PROCHLORPERAZINE EDISYLATE 10 MG/2ML IJ SOLN
10.0000 mg | Freq: Once | INTRAMUSCULAR | Status: AC
  Administered 2019-06-06: 10 mg via INTRAVENOUS
  Filled 2019-06-06: qty 2

## 2019-06-06 MED ORDER — DIPHENHYDRAMINE HCL 50 MG/ML IJ SOLN
12.5000 mg | Freq: Once | INTRAMUSCULAR | Status: AC
  Administered 2019-06-06: 12.5 mg via INTRAVENOUS
  Filled 2019-06-06: qty 1

## 2019-06-06 MED ORDER — FENTANYL CITRATE (PF) 100 MCG/2ML IJ SOLN
50.0000 ug | Freq: Once | INTRAMUSCULAR | Status: AC
  Administered 2019-06-06: 50 ug via INTRAVENOUS
  Filled 2019-06-06: qty 2

## 2019-06-06 NOTE — ED Notes (Signed)
Urine cup given to pt for u/a
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
## 2019-06-07 MED ORDER — TRAMADOL HCL 50 MG PO TABS: 50.0000 mg | ORAL_TABLET | Freq: Four times a day (QID) | ORAL | 0 refills | Status: AC | PRN

## 2019-06-07 MED ORDER — TRAMADOL HCL 50 MG PO TABS: 50.0000 mg | ORAL_TABLET | Freq: Four times a day (QID) | ORAL | 0 refills | Status: DC | PRN

## 2019-06-07 MED FILL — traMADol HCL 50 MG TABS: 50 | 2 days supply | Qty: 10 | Fill #0

## 2019-06-07 NOTE — Discharge Instructions (Addendum)
Ibuprofen 600 mg every 6 hours as needed for pain. ° °Follow-up with your primary doctor if symptoms are not improving in the next week. °
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
## 2019-09-04 ENCOUNTER — Other Ambulatory Visit: Payer: Self-pay

## 2019-09-04 ENCOUNTER — Emergency Department (HOSPITAL_BASED_OUTPATIENT_CLINIC_OR_DEPARTMENT_OTHER)
Admission: EM | Admit: 2019-09-04 | Discharge: 2019-09-04 | Disposition: A | Discharge status: Home / Self Care | Payer: Medicaid Other | Attending: Emergency Medicine | Admitting: Emergency Medicine

## 2019-09-04 ENCOUNTER — Encounter (HOSPITAL_BASED_OUTPATIENT_CLINIC_OR_DEPARTMENT_OTHER): Payer: Self-pay | Admitting: Emergency Medicine

## 2019-09-04 DIAGNOSIS — B379 Candidiasis, unspecified: Secondary | ICD-10-CM

## 2019-09-04 DIAGNOSIS — B373 Candidiasis of vulva and vagina: Secondary | ICD-10-CM | POA: Insufficient documentation

## 2019-09-04 DIAGNOSIS — Z79899 Other long term (current) drug therapy: Secondary | ICD-10-CM | POA: Insufficient documentation

## 2019-09-04 DIAGNOSIS — Z87891 Personal history of nicotine dependence: Secondary | ICD-10-CM | POA: Insufficient documentation

## 2019-09-04 DIAGNOSIS — Z880 Allergy status to penicillin: Secondary | ICD-10-CM | POA: Insufficient documentation

## 2019-09-04 DIAGNOSIS — Z794 Long term (current) use of insulin: Secondary | ICD-10-CM | POA: Insufficient documentation

## 2019-09-04 DIAGNOSIS — R739 Hyperglycemia, unspecified: Secondary | ICD-10-CM

## 2019-09-04 DIAGNOSIS — Z91040 Latex allergy status: Secondary | ICD-10-CM | POA: Insufficient documentation

## 2019-09-04 DIAGNOSIS — E1165 Type 2 diabetes mellitus with hyperglycemia: Secondary | ICD-10-CM | POA: Insufficient documentation

## 2019-09-04 LAB — URINALYSIS, MICROSCOPIC (REFLEX)

## 2019-09-04 LAB — BASIC METABOLIC PANEL
Anion gap: 11 (ref 5–15)
BUN: 10 mg/dL (ref 6–20)
CO2: 26 mmol/L (ref 22–32)
Calcium: 9.2 mg/dL (ref 8.9–10.3)
Chloride: 92 mmol/L — ABNORMAL LOW (ref 98–111)
Creatinine, Ser: 1.04 mg/dL — ABNORMAL HIGH (ref 0.44–1.00)
GFR calc Af Amer: >60 mL/min (ref >60)
GFR calc non Af Amer: >60 mL/min (ref >60)
Glucose, Bld: 753 mg/dL (ref 70–99)
Potassium: 4.1 mmol/L (ref 3.5–5.1)
Sodium: 129 mmol/L — ABNORMAL LOW (ref 135–145)

## 2019-09-04 LAB — CBC WITH DIFFERENTIAL/PLATELET
Abs Immature Granulocytes: 0.01 10*3/uL (ref 0.00–0.07)
Basophils Absolute: 0 10*3/uL (ref 0.0–0.1)
Basophils Relative: 1 %
Eosinophils Absolute: 0.2 10*3/uL (ref 0.0–0.5)
Eosinophils Relative: 2 %
HCT: 39.1 % (ref 36.0–46.0)
Hemoglobin: 13.2 g/dL (ref 12.0–15.0)
Immature Granulocytes: 0 %
Lymphocytes Relative: 36 %
Lymphs Abs: 2.7 10*3/uL (ref 0.7–4.0)
MCH: 29.7 pg (ref 26.0–34.0)
MCHC: 33.8 g/dL (ref 30.0–36.0)
MCV: 88.1 fL (ref 80.0–100.0)
Monocytes Absolute: 0.6 10*3/uL (ref 0.1–1.0)
Monocytes Relative: 8 %
Neutro Abs: 4.1 10*3/uL (ref 1.7–7.7)
Neutrophils Relative %: 53 %
Platelets: 276 10*3/uL (ref 150–400)
RBC: 4.44 MIL/uL (ref 3.87–5.11)
RDW: 12.7 % (ref 11.5–15.5)
WBC: 7.6 10*3/uL (ref 4.0–10.5)
nRBC: 0 % (ref 0.0–0.2)

## 2019-09-04 LAB — URINALYSIS, ROUTINE W REFLEX MICROSCOPIC
Bilirubin Urine: NEGATIVE
Glucose, UA: >=500 mg/dL — AB
Hgb urine dipstick: NEGATIVE
Ketones, ur: NEGATIVE mg/dL
Leukocytes,Ua: NEGATIVE
Nitrite: NEGATIVE
Protein, ur: NEGATIVE mg/dL
Specific Gravity, Urine: <1.005 — ABNORMAL LOW (ref 1.005–1.030)
pH: 6 (ref 5.0–8.0)

## 2019-09-04 LAB — CBG MONITORING, ED
Glucose-Capillary: >600 mg/dL (ref 70–99)
Glucose-Capillary: 559 mg/dL (ref 70–99)
Glucose-Capillary: 264 mg/dL — ABNORMAL HIGH (ref 70–99)

## 2019-09-04 LAB — PREGNANCY, URINE: Preg Test, Ur: NEGATIVE

## 2019-09-04 MED ORDER — SODIUM CHLORIDE 0.9 % IV BOLUS
1000.0000 mL | Freq: Once | INTRAVENOUS | Status: AC
  Administered 2019-09-04: 1000 mL via INTRAVENOUS

## 2019-09-04 MED ORDER — FLUCONAZOLE 150 MG PO TABS
150.0000 mg | ORAL_TABLET | Freq: Once | ORAL | Status: AC
  Administered 2019-09-04: 150 mg via ORAL
  Filled 2019-09-04: qty 1

## 2019-09-04 MED ORDER — INSULIN ASPART 100 UNIT/ML ~~LOC~~ SOLN
SUBCUTANEOUS | Status: AC
  Filled 2019-09-04: qty 1

## 2019-09-04 MED ORDER — FLUCONAZOLE 100 MG PO TABS
100.0000 mg | ORAL_TABLET | Freq: Once | ORAL | Status: DC
  Filled 2019-09-04: qty 1

## 2019-09-04 MED ORDER — INSULIN ASPART 100 UNIT/ML IV SOLN
10.0000 [IU] | Freq: Once | INTRAVENOUS | Status: AC
  Administered 2019-09-04: 10 [IU] via INTRAVENOUS
  Filled 2019-09-04: qty 0.1

## 2019-09-04 NOTE — ED Notes (Signed)
Glucose 753 lab result received; dr Wilkie Aye aware.

## 2019-09-04 NOTE — ED Triage Notes (Addendum)
Pt has been having MRIs and is having to take her insulin pump off and has ran out of medication. Pt also c/o vaginal irritation.

## 2019-09-04 NOTE — ED Provider Notes (Signed)
[facility] EMERGENCY DEPARTMENT Provider Note   CSN: [id number] Arrival date & time: 09/04/19  0131     History Chief Complaint  Patient presents with  . Hyperglycemia    Elaine Vasquez is a 29 y.o. female.  HPI     This is a 29 year old female with a history of diabetes on insulin pump, polyneuropathy who presents with concerns for high blood sugars.  Patient reports that she has had multiple imaging studies recently and this requires that she remove her insulin pump and her Dexcom sensor.  She lost a piece to her Dexcom sensor and has not been able to monitor her blood sugars in 2 to 3 weeks.  Because she cannot monitor her blood sugars, she has not been using her pump for either basal or bolus insulin doses.  She reports increased thirst, increased urination, vaginal discharge which she believes is likely yeast infection.  She denies any recent fevers, chest pain, abdominal pain.  Past Medical History:  Diagnosis Date  . Chronic midline thoracic back pain   . Diabetes mellitus without complication (HCC)   . Muscle spasm   . Noncompliance with medications   . Polyneuropathy   . Scoliosis   . Vaginal candida     There are no problems to display for this patient.   History reviewed. No pertinent surgical history.   OB History    Gravida  1   Para  1   Term      Preterm      AB      Living        SAB      TAB      Ectopic      Multiple      Live Births              No family history on file.  Social History   Tobacco Use  . Smoking status: Former Smoker    Types: Cigars  . Smokeless tobacco: Never Used  Substance Use Topics  . Alcohol use: Not Currently  . Drug use: No    Home Medications Prior to Admission medications   Medication Sig Start Date End Date Taking? Authorizing Provider  Continuous Blood Gluc Sensor (DEXCOM G6 SENSOR) MISC  05/29/19   [provider]  cyclobenzaprine (FLEXERIL) 10 MG tablet Take 1  tablet (10 mg total) by mouth 2 (two) times daily as needed for muscle spasms. 05/29/19   Robinson, [place] N, PA-C  fluconazole (DIFLUCAN) 150 MG tablet Take one tablet as needed for vaginal yeast infection. 12/16/16   Molpus, John, MD  glucose blood (ACCU-CHEK GUIDE) test strip Use as instructed 11/08/16   Joy, Shawn C, PA-C  insulin aspart (NOVOLOG) 100 unit/mL injection Inject 10 Units into the skin 3 (three) times daily before meals. 02/15/17   Molpus, John, MD  insulin aspart (NOVOLOG) 100 UNIT/ML injection Inject 200units via omnipod pump every 3 days 05/29/19   Robinson, [place] N, PA-C  Insulin Disposable Pump (OMNIPOD DASH 5 PACK PODS) MISC 1 each by Does not apply route every 3 (three) days. 05/29/19   Robinson, [place] N, PA-C  insulin glargine (LANTUS) 100 UNIT/ML injection Inject into the skin at bedtime.    [provider]  naproxen (NAPROSYN) 500 MG tablet Take 1 tablet (500 mg total) by mouth 2 (two) times daily. 05/31/19   Arlyn Dunning, PA-C  traMADol (ULTRAM) 50 MG tablet Take 1 tablet (50 mg total) by mouth every 6 (six)  hours as needed. 06/07/19   Veryl Speak, MD    Allergies    Latex and Amoxicillin  Review of Systems   Review of Systems  Constitutional: Negative for fever.  Respiratory: Negative for shortness of breath.   Cardiovascular: Negative for chest pain.  Gastrointestinal: Negative for abdominal pain, nausea and vomiting.  Endocrine: Positive for polydipsia and polyuria.  Genitourinary: Positive for vaginal discharge.  All other systems reviewed and are negative.   Physical Exam Updated Vital Signs BP (!) 112/58   Pulse 72   Temp 97.9 F (36.6 C) (Oral)   Resp 16   Ht 1.6 m (5\' 3" )   Wt 75.6 kg   SpO2 100%   BMI 29.52 kg/m   Physical Exam Vitals and nursing note reviewed.  Constitutional:      Appearance: She is well-developed. She is not ill-appearing.  HENT:     Head: Normocephalic and atraumatic.     Nose: Nose normal.     Mouth/Throat:      Mouth: Mucous membranes are moist.  Eyes:     Pupils: Pupils are equal, round, and reactive to light.  Cardiovascular:     Rate and Rhythm: Normal rate and regular rhythm.     Heart sounds: Normal heart sounds.  Pulmonary:     Effort: Pulmonary effort is normal. No respiratory distress.     Breath sounds: No wheezing.  Abdominal:     General: Bowel sounds are normal.     Palpations: Abdomen is soft.     Tenderness: There is no abdominal tenderness. There is no guarding or rebound.  Genitourinary:    Comments: Deferred Musculoskeletal:     Cervical back: Neck supple.     Right lower leg: No edema.     Left lower leg: No edema.  Skin:    General: Skin is warm and dry.  Neurological:     Mental Status: She is alert and oriented to person, place, and time.  Psychiatric:        Mood and Affect: Mood normal.     ED Results / Procedures / Treatments   Labs (all labs ordered are listed, but only abnormal results are displayed) Labs Reviewed  BASIC METABOLIC PANEL - Abnormal; Notable for the following components:      Result Value   Sodium 129 (*)    Chloride 92 (*)    Glucose, Bld 753 (*)    Creatinine, Ser 1.04 (*)    All other components within normal limits  URINALYSIS, ROUTINE W REFLEX MICROSCOPIC - Abnormal; Notable for the following components:   Color, Urine COLORLESS (*)    Specific Gravity, Urine <1.005 (*)    Glucose, UA >=500 (*)    All other components within normal limits  URINALYSIS, MICROSCOPIC (REFLEX) - Abnormal; Notable for the following components:   Bacteria, UA RARE (*)    All other components within normal limits  CBG MONITORING, ED - Abnormal; Notable for the following components:   Glucose-Capillary >600 (*)    All other components within normal limits  CBG MONITORING, ED - Abnormal; Notable for the following components:   Glucose-Capillary 559 (*)    All other components within normal limits  CBG MONITORING, ED - Abnormal; Notable for the  following components:   Glucose-Capillary 264 (*)    All other components within normal limits  CBC WITH DIFFERENTIAL/PLATELET  PREGNANCY, URINE    EKG None  Radiology No results found.  Procedures Procedures (including critical care time)  CRITICAL CARE Performed by: Shon Baton   Total critical care time: 35 minutes  Critical care time was exclusive of separately billable procedures and treating other patients.  Critical care was necessary to treat or prevent imminent or life-threatening deterioration.  Critical care was time spent personally by me on the following activities: development of treatment plan with patient and/or surrogate as well as nursing, discussions with consultants, evaluation of patient's response to treatment, examination of patient, obtaining history from patient or surrogate, ordering and performing treatments and interventions, ordering and review of laboratory studies, ordering and review of radiographic studies, pulse oximetry and re-evaluation of patient's condition.   Medications Ordered in ED Medications  sodium chloride 0.9 % bolus 1,000 mL (0 mLs Intravenous Stopped 09/04/19 0343)  insulin aspart (novoLOG) injection 10 Units (10 Units Intravenous Given 09/04/19 0406)  sodium chloride 0.9 % bolus 1,000 mL (0 mLs Intravenous Stopped 09/04/19 0454)  fluconazole (DIFLUCAN) tablet 150 mg (150 mg Oral Given 09/04/19 0432)    ED Course  I have reviewed the triage vital signs and the nursing notes.  Pertinent labs & imaging results that were available during my care of the patient were reviewed by me and considered in my medical decision making (see chart for details).    MDM Rules/Calculators/A&P                       Patient presents with concerns for hyperglycemia.  Initial blood glucose is greater than 600.  She is overall nontoxic and vital signs are reassuring.  She is afebrile.  She has not been using her insulin pump because she cannot  check her blood sugars.  She reports insurance issues with getting a replacement.  Patient was given a liter of fluids.  Initial blood sugars greater than 700.  No anion gap.  Doubt DKA.  Second liter of fluid was ordered as well as 10 of IV insulin.  Blood sugar noted to be downtrending with last blood sugars in the mid 200s.  Patient states she feels much better.  She does report some vaginal discharge but defers vaginal exam.  Given her hyperglycemia, would suspect this is likely use.  She was given a dose of Diflucan.  I discussed with her the importance of monitoring her blood sugars and using her insulin pump to avoid diabetic complications.  I have requested social work call the patient to see if there is anything that we can do to help her get her additional piece to her Dexcom.  After history, exam, and medical workup I feel the patient has been appropriately medically screened and is safe for discharge home. Pertinent diagnoses were discussed with the patient. Patient was given return precautions.   Final Clinical Impression(s) / ED Diagnoses Final diagnoses:  Hyperglycemia  Yeast infection    Rx / DC Orders ED Discharge Orders    None       Charise Leinbach, Mayer Masker, MD 09/04/19 [phone]

## 2019-09-04 NOTE — Discharge Instructions (Addendum)
You were seen today for high blood sugars.  It is very important that you have a way to monitor your blood sugars and start your insulin pump.  Persistently high blood sugars can cause complications.  Follow-up with your primary physician.  I did place a consult for our social worker to contact you to see if there is anything that we can do to help you.

## 2019-09-05 NOTE — Progress Notes (Signed)
09/05/2019 2:08 pm TOC CM did follow up with referral. Spoke to pt and she had to open a new sensor to replace the one she lost. States it will expire in 3 months and she will be short again. Explained she may can pay for the sensor out of pocket. Encouraged her to go online to Dexom G6 for prices. States insurance on sends her a limited supply each year. Pt states she will contact her PCP to follow up on solutions prior to the 3 months being up for replacement of Dexcom G6 sensor. States she currently back on regimen and is feeling better. ED provider updated. Isidoro Donning RN CCM, [facility] ED TOC CM [phone]

## 2019-10-17 ENCOUNTER — Emergency Department (HOSPITAL_BASED_OUTPATIENT_CLINIC_OR_DEPARTMENT_OTHER)
Admission: EM | Admit: 2019-10-17 | Discharge: 2019-10-17 | Disposition: A | Discharge status: Home / Self Care | Payer: Medicaid Other | Attending: Emergency Medicine | Admitting: Emergency Medicine

## 2019-10-17 ENCOUNTER — Encounter (HOSPITAL_BASED_OUTPATIENT_CLINIC_OR_DEPARTMENT_OTHER): Payer: Self-pay

## 2019-10-17 ENCOUNTER — Emergency Department (HOSPITAL_BASED_OUTPATIENT_CLINIC_OR_DEPARTMENT_OTHER): Payer: Medicaid Other

## 2019-10-17 ENCOUNTER — Other Ambulatory Visit: Payer: Self-pay

## 2019-10-17 DIAGNOSIS — R109 Unspecified abdominal pain: Secondary | ICD-10-CM

## 2019-10-17 DIAGNOSIS — Z3A01 Less than 8 weeks gestation of pregnancy: Secondary | ICD-10-CM | POA: Diagnosis not present

## 2019-10-17 DIAGNOSIS — O26891 Other specified pregnancy related conditions, first trimester: Secondary | ICD-10-CM | POA: Diagnosis present

## 2019-10-17 DIAGNOSIS — R102 Pelvic and perineal pain: Secondary | ICD-10-CM | POA: Insufficient documentation

## 2019-10-17 DIAGNOSIS — Z331 Pregnant state, incidental: Secondary | ICD-10-CM

## 2019-10-17 DIAGNOSIS — O418X10 Other specified disorders of amniotic fluid and membranes, first trimester, not applicable or unspecified: Secondary | ICD-10-CM | POA: Insufficient documentation

## 2019-10-17 DIAGNOSIS — O468X1 Other antepartum hemorrhage, first trimester: Secondary | ICD-10-CM | POA: Insufficient documentation

## 2019-10-17 DIAGNOSIS — R52 Pain, unspecified: Secondary | ICD-10-CM

## 2019-10-17 LAB — CBC WITH DIFFERENTIAL/PLATELET
Abs Immature Granulocytes: 0.05 10*3/uL (ref 0.00–0.07)
Basophils Absolute: 0 10*3/uL (ref 0.0–0.1)
Basophils Relative: 0 %
Eosinophils Absolute: 0.1 10*3/uL (ref 0.0–0.5)
Eosinophils Relative: 1 %
HCT: 36 % (ref 36.0–46.0)
Hemoglobin: 12.4 g/dL (ref 12.0–15.0)
Immature Granulocytes: 1 %
Lymphocytes Relative: 24 %
Lymphs Abs: 2.7 10*3/uL (ref 0.7–4.0)
MCH: 30 pg (ref 26.0–34.0)
MCHC: 34.4 g/dL (ref 30.0–36.0)
MCV: 87 fL (ref 80.0–100.0)
Monocytes Absolute: 0.6 10*3/uL (ref 0.1–1.0)
Monocytes Relative: 5 %
Neutro Abs: 7.5 10*3/uL (ref 1.7–7.7)
Neutrophils Relative %: 69 %
Platelets: 250 10*3/uL (ref 150–400)
RBC: 4.14 MIL/uL (ref 3.87–5.11)
RDW: 12.8 % (ref 11.5–15.5)
WBC: 11 10*3/uL — ABNORMAL HIGH (ref 4.0–10.5)
nRBC: 0 % (ref 0.0–0.2)

## 2019-10-17 LAB — BASIC METABOLIC PANEL
Anion gap: 7 (ref 5–15)
BUN: 8 mg/dL (ref 6–20)
CO2: 25 mmol/L (ref 22–32)
Calcium: 8.4 mg/dL — ABNORMAL LOW (ref 8.9–10.3)
Chloride: 99 mmol/L (ref 98–111)
Creatinine, Ser: 0.59 mg/dL (ref 0.44–1.00)
GFR calc Af Amer: >60 mL/min (ref >60)
GFR calc non Af Amer: >60 mL/min (ref >60)
Glucose, Bld: 233 mg/dL — ABNORMAL HIGH (ref 70–99)
Potassium: 4 mmol/L (ref 3.5–5.1)
Sodium: 131 mmol/L — ABNORMAL LOW (ref 135–145)

## 2019-10-17 LAB — URINALYSIS, ROUTINE W REFLEX MICROSCOPIC
Bilirubin Urine: NEGATIVE
Glucose, UA: >=500 mg/dL — AB
Hgb urine dipstick: NEGATIVE
Ketones, ur: NEGATIVE mg/dL
Leukocytes,Ua: NEGATIVE
Nitrite: NEGATIVE
Protein, ur: NEGATIVE mg/dL
Specific Gravity, Urine: 1.015 (ref 1.005–1.030)
pH: 6 (ref 5.0–8.0)

## 2019-10-17 LAB — WET PREP, GENITAL
Clue Cells Wet Prep HPF POC: NONE SEEN
Sperm: NONE SEEN
Trich, Wet Prep: NONE SEEN
Yeast Wet Prep HPF POC: NONE SEEN

## 2019-10-17 LAB — PREGNANCY, URINE: Preg Test, Ur: POSITIVE — AB

## 2019-10-17 LAB — URINALYSIS, MICROSCOPIC (REFLEX)

## 2019-10-17 LAB — HCG, QUANTITATIVE, PREGNANCY: hCG, Beta Chain, Quant, S: 78457 m[IU]/mL — ABNORMAL HIGH (ref <5)

## 2019-10-17 NOTE — Discharge Instructions (Addendum)
Follow up with your OB as scheduled
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
## 2019-10-17 NOTE — ED Triage Notes (Signed)
Pt c/o abd cramping x 1 week-states she had +home preg test with LMP 4/10-NAD-steady gait

## 2019-10-18 LAB — GC/CHLAMYDIA PROBE AMP (~~LOC~~) NOT AT ARMC
Chlamydia: NEGATIVE
Comment: NEGATIVE
Comment: NORMAL
Neisseria Gonorrhea: NEGATIVE

## 2019-11-20 ENCOUNTER — Emergency Department (HOSPITAL_BASED_OUTPATIENT_CLINIC_OR_DEPARTMENT_OTHER)
Admission: EM | Admit: 2019-11-20 | Discharge: 2019-11-20 | Disposition: A | Discharge status: Home / Self Care | Payer: Medicaid Other | Attending: Emergency Medicine | Admitting: Emergency Medicine

## 2019-11-20 ENCOUNTER — Other Ambulatory Visit: Payer: Self-pay

## 2019-11-20 ENCOUNTER — Encounter (HOSPITAL_BASED_OUTPATIENT_CLINIC_OR_DEPARTMENT_OTHER): Payer: Self-pay | Admitting: Emergency Medicine

## 2019-11-20 DIAGNOSIS — R739 Hyperglycemia, unspecified: Secondary | ICD-10-CM | POA: Diagnosis not present

## 2019-11-20 DIAGNOSIS — Z5321 Procedure and treatment not carried out due to patient leaving prior to being seen by health care provider: Secondary | ICD-10-CM | POA: Insufficient documentation

## 2019-11-20 LAB — CBG MONITORING, ED: Glucose-Capillary: 328 mg/dL — ABNORMAL HIGH (ref 70–99)

## 2019-11-20 NOTE — ED Notes (Signed)
Called pt for room 3 no answer in lobby. Called pt on cell phone [phone] pt states she decided to go home

## 2019-11-20 NOTE — ED Triage Notes (Signed)
Pt states she went to her diabetes specialist last week and they adjusted her insulin pump and since she has been having problems keeping her sugar down
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
## 2023-04-06 ENCOUNTER — Encounter (HOSPITAL_BASED_OUTPATIENT_CLINIC_OR_DEPARTMENT_OTHER): Payer: Self-pay

## 2023-04-06 ENCOUNTER — Other Ambulatory Visit: Payer: Self-pay

## 2023-04-06 ENCOUNTER — Emergency Department (HOSPITAL_BASED_OUTPATIENT_CLINIC_OR_DEPARTMENT_OTHER)
Admission: EM | Admit: 2023-04-06 | Discharge: 2023-04-07 | Disposition: A | Discharge status: Home / Self Care | Payer: Medicaid Other | Attending: Emergency Medicine | Admitting: Emergency Medicine

## 2023-04-06 ENCOUNTER — Emergency Department (HOSPITAL_BASED_OUTPATIENT_CLINIC_OR_DEPARTMENT_OTHER): Payer: Medicaid Other

## 2023-04-06 DIAGNOSIS — Z20822 Contact with and (suspected) exposure to covid-19: Secondary | ICD-10-CM | POA: Diagnosis not present

## 2023-04-06 DIAGNOSIS — R072 Precordial pain: Secondary | ICD-10-CM | POA: Insufficient documentation

## 2023-04-06 DIAGNOSIS — Z91040 Latex allergy status: Secondary | ICD-10-CM | POA: Insufficient documentation

## 2023-04-06 DIAGNOSIS — R0602 Shortness of breath: Secondary | ICD-10-CM | POA: Insufficient documentation

## 2023-04-06 DIAGNOSIS — J069 Acute upper respiratory infection, unspecified: Secondary | ICD-10-CM

## 2023-04-06 DIAGNOSIS — R0789 Other chest pain: Secondary | ICD-10-CM | POA: Diagnosis present

## 2023-04-06 LAB — BASIC METABOLIC PANEL
Anion gap: 6 (ref 5–15)
BUN: 9 mg/dL (ref 6–20)
CO2: 28 mmol/L (ref 22–32)
Calcium: 8.8 mg/dL — ABNORMAL LOW (ref 8.9–10.3)
Chloride: 102 mmol/L (ref 98–111)
Creatinine, Ser: 0.79 mg/dL (ref 0.44–1.00)
GFR, Estimated: >60 mL/min (ref >60)
Glucose, Bld: 132 mg/dL — ABNORMAL HIGH (ref 70–99)
Potassium: 3.4 mmol/L — ABNORMAL LOW (ref 3.5–5.1)
Sodium: 136 mmol/L (ref 135–145)

## 2023-04-06 LAB — GLUCOSE, CAPILLARY: Glucose-Capillary: 127 mg/dL — ABNORMAL HIGH (ref 70–99)

## 2023-04-06 LAB — CBC
HCT: 34.7 % — ABNORMAL LOW (ref 36.0–46.0)
Hemoglobin: 11.5 g/dL — ABNORMAL LOW (ref 12.0–15.0)
MCH: 29.3 pg (ref 26.0–34.0)
MCHC: 33.1 g/dL (ref 30.0–36.0)
MCV: 88.3 fL (ref 80.0–100.0)
Platelets: 274 10*3/uL (ref 150–400)
RBC: 3.93 MIL/uL (ref 3.87–5.11)
RDW: 12.6 % (ref 11.5–15.5)
WBC: 11.8 10*3/uL — ABNORMAL HIGH (ref 4.0–10.5)
nRBC: 0 % (ref 0.0–0.2)

## 2023-04-06 LAB — PREGNANCY, URINE: Preg Test, Ur: NEGATIVE

## 2023-04-06 LAB — RESP PANEL BY RT-PCR (RSV, FLU A&B, COVID)  RVPGX2
Influenza A by PCR: NEGATIVE
Influenza B by PCR: NEGATIVE
Resp Syncytial Virus by PCR: NEGATIVE
SARS Coronavirus 2 by RT PCR: NEGATIVE

## 2023-04-06 LAB — GROUP A STREP BY PCR: Group A Strep by PCR: NOT DETECTED

## 2023-04-06 LAB — TROPONIN I (HIGH SENSITIVITY): Troponin I (High Sensitivity): 4 ng/L (ref <18)

## 2023-04-06 NOTE — ED Triage Notes (Signed)
Pt reports headache, shortness of breath, chest pain (started 3 months ago), left side body numbness, body aches and she says she thinks the right side of her face is larger than normal. She states she thinks she stops breathing when she sleeps. She also reports sore throat that started this morning.

## 2023-04-06 NOTE — ED Notes (Signed)
Pt stated that her glucose meter read that her blood glucose was 47. Pt pulled into triage, CBG 127.

## 2023-04-07 ENCOUNTER — Other Ambulatory Visit (HOSPITAL_BASED_OUTPATIENT_CLINIC_OR_DEPARTMENT_OTHER): Payer: Self-pay

## 2023-04-07 MED ORDER — ALUM & MAG HYDROXIDE-SIMETH 200-200-20 MG/5ML PO SUSP
30.0000 mL | Freq: Once | ORAL | Status: AC
  Administered 2023-04-07: 30 mL via ORAL
  Filled 2023-04-07: qty 30

## 2023-04-07 MED ORDER — NAPROXEN 250 MG PO TABS
500.0000 mg | ORAL_TABLET | Freq: Once | ORAL | Status: AC
Start: 2023-04-07 — End: 2023-04-07
  Administered 2023-04-07: 500 mg via ORAL
  Filled 2023-04-07: qty 2

## 2023-04-07 MED ORDER — FLUTICASONE PROPIONATE 50 MCG/ACT NA SUSP
2.0000 | Freq: Every day | NASAL | 0 refills | Status: AC
  Filled 2023-04-07 (×2): qty 16, 30d supply, fill #0

## 2023-04-07 NOTE — ED Provider Notes (Addendum)
Elaine Vasquez Provider Note   CSN: [id number] Arrival date & time: 04/06/23  2022     History  Chief Complaint  Patient presents with   Multiple Complaints    Elaine Vasquez is a 32 y.o. female.  The history is provided by the patient.  URI Presenting symptoms: congestion and sore throat   Presenting symptoms: no fever   Severity:  Moderate Onset quality:  Gradual Duration:  1 day Timing:  Constant Progression:  Unchanged Chronicity:  New Relieved by:  Nothing Worsened by:  Nothing Risk factors: not elderly   Patient has also had multiple months of chest discomfort and SOB at rest.  No exertional symptoms no OCP, no travel.  No pleuritic component.       Home Medications Prior to Admission medications   Medication Sig Start Date End Date Taking? Authorizing Provider  Continuous Blood Gluc Sensor (DEXCOM G6 SENSOR) MISC  05/29/19   [provider]  cyclobenzaprine (FLEXERIL) 10 MG tablet Take 1 tablet (10 mg total) by mouth 2 (two) times daily as needed for muscle spasms. 05/29/19   Robinson, [place] N, PA-C  fluconazole (DIFLUCAN) 150 MG tablet Take one tablet as needed for vaginal yeast infection. 12/16/16   Molpus, John, MD  glucose blood (ACCU-CHEK GUIDE) test strip Use as instructed 11/08/16   Joy, Shawn C, PA-C  insulin aspart (NOVOLOG) 100 unit/mL injection Inject 10 Units into the skin 3 (three) times daily before meals. 02/15/17   Molpus, John, MD  insulin aspart (NOVOLOG) 100 UNIT/ML injection Inject 200units via omnipod pump every 3 days 05/29/19   Robinson, [place] N, PA-C  Insulin Disposable Pump (OMNIPOD DASH 5 PACK PODS) MISC 1 each by Does not apply route every 3 (three) days. 05/29/19   Robinson, [place] N, PA-C  insulin glargine (LANTUS) 100 UNIT/ML injection Inject into the skin at bedtime.    [provider]  naproxen (NAPROSYN) 500 MG tablet Take 1 tablet (500 mg total) by mouth 2 (two) times daily.  05/31/19   Arlyn Dunning, PA-C  traMADol (ULTRAM) 50 MG tablet Take 1 tablet (50 mg total) by mouth every 6 (six) hours as needed. 06/07/19   Geoffery Lyons, MD      Allergies    Latex and Amoxicillin    Review of Systems   Review of Systems  Constitutional:  Negative for fever.  HENT:  Positive for congestion and sore throat.   Cardiovascular:  Positive for chest pain. Negative for leg swelling.  Gastrointestinal:  Negative for vomiting.  All other systems reviewed and are negative.   Physical Exam Updated Vital Signs BP (!) 141/94 (BP Location: Left Arm)   Pulse 88   Temp 97.8 F (36.6 C)   Resp 20   Ht 5\' 3"  (1.6 m)   Wt 78.5 kg   LMP  (LMP Unknown)   SpO2 100%   Breastfeeding No   BMI 30.65 kg/m  Physical Exam Vitals and nursing note reviewed.  Constitutional:      General: She is not in acute distress.    Appearance: She is well-developed.  HENT:     Head: Normocephalic and atraumatic.     Nose: Nose normal. No rhinorrhea.     Mouth/Throat:     Mouth: Mucous membranes are moist.     Pharynx: Oropharynx is clear.  Eyes:     Extraocular Movements: Extraocular movements intact.     Conjunctiva/sclera: Conjunctivae normal.     Pupils:  Pupils are equal, round, and reactive to light.  Cardiovascular:     Rate and Rhythm: Normal rate and regular rhythm.     Pulses: Normal pulses.     Heart sounds: Normal heart sounds.  Pulmonary:     Effort: Pulmonary effort is normal. No respiratory distress.     Breath sounds: Normal breath sounds.  Abdominal:     General: Bowel sounds are normal. There is no distension.     Palpations: Abdomen is soft.     Tenderness: There is no abdominal tenderness. There is no guarding or rebound.  Musculoskeletal:        General: Normal range of motion.     Cervical back: Normal range of motion and neck supple. No rigidity.  Lymphadenopathy:     Cervical: No cervical adenopathy.  Skin:    General: Skin is warm and dry.     Capillary  Refill: Capillary refill takes less than 2 seconds.     Findings: No erythema or rash.  Neurological:     General: No focal deficit present.     Mental Status: She is oriented to person, place, and time.     Cranial Nerves: No cranial nerve deficit.     Deep Tendon Reflexes: Reflexes normal.     ED Results / Procedures / Treatments   Labs (all labs ordered are listed, but only abnormal results are displayed) Results for orders placed or performed during the hospital encounter of 04/06/23  Resp panel by RT-PCR (RSV, Flu A&B, Covid) Anterior Nasal Swab   Specimen: Anterior Nasal Swab  Result Value Ref Range   SARS Coronavirus 2 by RT PCR NEGATIVE NEGATIVE   Influenza A by PCR NEGATIVE NEGATIVE   Influenza B by PCR NEGATIVE NEGATIVE   Resp Syncytial Virus by PCR NEGATIVE NEGATIVE  Group A Strep by PCR   Specimen: Throat; Sterile Swab  Result Value Ref Range   Group A Strep by PCR NOT DETECTED NOT DETECTED  Basic metabolic panel  Result Value Ref Range   Sodium 136 135 - 145 mmol/L   Potassium 3.4 (L) 3.5 - 5.1 mmol/L   Chloride 102 98 - 111 mmol/L   CO2 28 22 - 32 mmol/L   Glucose, Bld 132 (H) 70 - 99 mg/dL   BUN 9 6 - 20 mg/dL   Creatinine, Ser 2.13 0.44 - 1.00 mg/dL   Calcium 8.8 (L) 8.9 - 10.3 mg/dL   GFR, Estimated >08 >65 mL/min   Anion gap 6 5 - 15  CBC  Result Value Ref Range   WBC 11.8 (H) 4.0 - 10.5 K/uL   RBC 3.93 3.87 - 5.11 MIL/uL   Hemoglobin 11.5 (L) 12.0 - 15.0 g/dL   HCT 78.4 (L) 69.6 - 29.5 %   MCV 88.3 80.0 - 100.0 fL   MCH 29.3 26.0 - 34.0 pg   MCHC 33.1 30.0 - 36.0 g/dL   RDW 28.4 13.2 - 44.0 %   Platelets 274 150 - 400 K/uL   nRBC 0.0 0.0 - 0.2 %  Pregnancy, urine  Result Value Ref Range   Preg Test, Ur NEGATIVE NEGATIVE  Glucose, capillary  Result Value Ref Range   Glucose-Capillary 127 (H) 70 - 99 mg/dL   Comment 1 Notify RN   Troponin I (High Sensitivity)  Result Value Ref Range   Troponin I (High Sensitivity) 4 <18 ng/L   DG Chest 2  View  Result Date: 04/06/2023 CLINICAL DATA:  Chest pain. EXAM: CHEST - 2  VIEW COMPARISON:  October 25, 2021 FINDINGS: The heart size and mediastinal contours are within normal limits. Both lungs are clear. The visualized skeletal structures are unremarkable. IMPRESSION: No active cardiopulmonary disease. Electronically Signed   By: Aram Candela M.D.   On: 04/06/2023 22:38    EKG Interpretation Date/Time:  Wednesday April 06 2023 20:31:56 EST Ventricular Rate:  83 PR Interval:  156 QRS Duration:  80 QT Interval:  364 QTC Calculation: 427 R Axis:   87  Text Interpretation: Normal sinus rhythm Confirmed by Nicanor Alcon, Charlann Wayne ([id number]) on 04/07/2023 12:21:34 AM  Radiology DG Chest 2 View  Result Date: 04/06/2023 CLINICAL DATA:  Chest pain. EXAM: CHEST - 2 VIEW COMPARISON:  October 25, 2021 FINDINGS: The heart size and mediastinal contours are within normal limits. Both lungs are clear. The visualized skeletal structures are unremarkable. IMPRESSION: No active cardiopulmonary disease. Electronically Signed   By: Aram Candela M.D.   On: 04/06/2023 22:38    Procedures Procedures    Medications Ordered in ED Medications - No data to display  ED Course/ Medical Decision Making/ A&P                                 Medical Decision Making Patient with URI today and 3 months   Amount and/or Complexity of Data Reviewed External Data Reviewed: notes.    Details: Previous notes reviewed  Labs: ordered.    Details: Negative troponin 4, white count slight elevation 11.8, hemoglobin slight low 11.4 normal platelets.  Negative covid and flu and negative strep.  Normal creatinine 0.79  Radiology: ordered and independent interpretation performed.  Risk OTC drugs. Prescription drug management. Risk Details: Patient with URI today and 3-4 months of CP and SOb at rest.  PERC negative wells negative highly doubt PE in this low risk patient.  Ruled out for MI based on time course.  HEART score  0, very low risk for MACE.  Negative CXR for PNA.  Likely GERD,  stable for discharge with close follow up.  Will treat URI symptomatically.    8  Final Clinical Impression(s) / ED Diagnoses Final diagnoses:  Precordial pain  Return for intractable cough, coughing up blood, fevers > 100.4 unrelieved by medication, shortness of breath, intractable vomiting, chest pain, shortness of breath, weakness, numbness, changes in speech, facial asymmetry, abdominal pain, passing out, Inability to tolerate liquids or food, cough, altered mental status or any concerns. No signs of systemic illness or infection. The patient is nontoxic-appearing on exam and vital signs are within normal limits.  I have reviewed the triage vital signs and the nursing notes. Pertinent labs & imaging results that were available during my care of the patient were reviewed by me and considered in my medical decision making (see chart for details). After history, exam, and medical workup I feel the patient has been appropriately medically screened and is safe for discharge home. Pertinent diagnoses were discussed with the patient. Patient was given return precautions.   Rx / DC Orders ED Discharge Orders     None         Massimo Hartland, MD 04/07/23 1914    Nicanor Alcon, Blakelynn Scheeler, MD 04/07/23 7829

## 2023-04-07 NOTE — ED Notes (Signed)

## 2023-04-19 ENCOUNTER — Other Ambulatory Visit (HOSPITAL_BASED_OUTPATIENT_CLINIC_OR_DEPARTMENT_OTHER): Payer: Self-pay

## 2024-04-28 ENCOUNTER — Emergency Department (HOSPITAL_BASED_OUTPATIENT_CLINIC_OR_DEPARTMENT_OTHER)

## 2024-04-28 ENCOUNTER — Emergency Department (HOSPITAL_BASED_OUTPATIENT_CLINIC_OR_DEPARTMENT_OTHER)
Admission: EM | Admit: 2024-04-28 | Discharge: 2024-04-28 | Disposition: A | Discharge status: Home / Self Care | Attending: Emergency Medicine | Admitting: Emergency Medicine

## 2024-04-28 ENCOUNTER — Other Ambulatory Visit: Payer: Self-pay

## 2024-04-28 ENCOUNTER — Encounter (HOSPITAL_BASED_OUTPATIENT_CLINIC_OR_DEPARTMENT_OTHER): Payer: Self-pay | Admitting: Emergency Medicine

## 2024-04-28 DIAGNOSIS — R102 Pelvic and perineal pain unspecified side: Secondary | ICD-10-CM

## 2024-04-28 DIAGNOSIS — B9689 Other specified bacterial agents as the cause of diseases classified elsewhere: Secondary | ICD-10-CM

## 2024-04-28 LAB — URINALYSIS, MICROSCOPIC (REFLEX)
Bacteria, UA: NONE SEEN
RBC / HPF: NONE SEEN RBC/hpf (ref 0–5)

## 2024-04-28 LAB — URINALYSIS, ROUTINE W REFLEX MICROSCOPIC
Bilirubin Urine: NEGATIVE
Glucose, UA: >=500 mg/dL — AB
Hgb urine dipstick: NEGATIVE
Ketones, ur: NEGATIVE mg/dL
Leukocytes,Ua: NEGATIVE
Nitrite: NEGATIVE
Protein, ur: NEGATIVE mg/dL
Specific Gravity, Urine: 1.015 (ref 1.005–1.030)
pH: 6 (ref 5.0–8.0)

## 2024-04-28 LAB — WET PREP, GENITAL
Sperm: NONE SEEN
Trich, Wet Prep: NONE SEEN
WBC, Wet Prep HPF POC: >=10 — AB (ref <10)
Yeast Wet Prep HPF POC: NONE SEEN

## 2024-04-28 LAB — PREGNANCY, URINE: Preg Test, Ur: NEGATIVE

## 2024-04-28 LAB — HIV ANTIBODY (ROUTINE TESTING W REFLEX): HIV Screen 4th Generation wRfx: NONREACTIVE

## 2024-04-28 MED ORDER — FLUCONAZOLE 150 MG PO TABS: 150.0000 mg | ORAL_TABLET | Freq: Every day | ORAL | 0 refills | Status: AC

## 2024-04-28 MED ORDER — METRONIDAZOLE 500 MG PO TABS: 500.0000 mg | ORAL_TABLET | Freq: Two times a day (BID) | ORAL | 0 refills | Status: AC

## 2024-04-28 NOTE — ED Provider Notes (Signed)
 [facility] Provider Note   CSN: [id number] Arrival date & time: 04/28/24  1533     Patient presents with: Vaginal Pain   Sadeel Basford is a 33 y.o. female who presents emergency department chief complaint of pelvic pain.  She had a new sexual liaison about 3 weeks ago.  She is concerned because she has been having some foul odor and some discomfort in her pelvis she also noted that when she felt inside her strings in her IUD felt much longer and she was concerned that she might have dislodged her Mirena IUD.  She denies any severe vaginal bleeding fevers chills or other complaints.  {Add pertinent medical, surgical, social history, OB history to YEP:67052}  Vaginal Pain       Prior to Admission medications  Medication Sig Start Date End Date Taking? Authorizing Provider  Continuous Blood Gluc Sensor (DEXCOM G6 SENSOR) MISC  05/29/19   [provider]  cyclobenzaprine  (FLEXERIL ) 10 MG tablet Take 1 tablet (10 mg total) by mouth 2 (two) times daily as needed for muscle spasms. 05/29/19   Robinson, Jordan N, PA-C  fluconazole  (DIFLUCAN ) 150 MG tablet Take one tablet as needed for vaginal yeast infection. 12/16/16   Molpus, John, MD  fluticasone  (FLONASE ) 50 MCG/ACT nasal spray Place 2 sprays into both nostrils daily. 04/07/23   Palumbo, April, MD  glucose blood (ACCU-CHEK GUIDE) test strip Use as instructed 11/08/16   Joy, Shawn C, PA-C  insulin  aspart (NOVOLOG ) 100 unit/mL injection Inject 10 Units into the skin 3 (three) times daily before meals. 02/15/17   Molpus, John, MD  insulin  aspart (NOVOLOG ) 100 UNIT/ML injection Inject 200units via omnipod pump every 3 days 05/29/19   Robinson, Jordan N, PA-C  Insulin  Disposable Pump (OMNIPOD DASH 5 PACK PODS) MISC 1 each by Does not apply route every 3 (three) days. 05/29/19   Robinson, Jordan N, PA-C  insulin  glargine (LANTUS) 100 UNIT/ML injection Inject into the skin at bedtime.    [provider]  naproxen  (NAPROSYN ) 500 MG tablet Take 1 tablet (500 mg total) by mouth 2 (two) times daily. 05/31/19   Rolan Burnard LABOR, PA-C  traMADol  (ULTRAM ) 50 MG tablet Take 1 tablet (50 mg total) by mouth every 6 (six) hours as needed. 06/07/19   Geroldine Berg, MD    Allergies: Latex and Amoxicillin    Review of Systems  Genitourinary:  Positive for vaginal pain.    Updated Vital Signs BP 123/75 (BP Location: Right Arm)   Pulse 94   Temp 98.8 F (37.1 C) (Oral)   Resp 17   Ht 5' 3 (1.6 m)   SpO2 100%   BMI 30.65 kg/m   Physical Exam Vitals and nursing note reviewed.  Constitutional:      General: She is not in acute distress.    Appearance: She is well-developed. She is not diaphoretic.  HENT:     Head: Normocephalic and atraumatic.     Right Ear: External ear normal.     Left Ear: External ear normal.     Nose: Nose normal.     Mouth/Throat:     Mouth: Mucous membranes are moist.  Eyes:     General: No scleral icterus.    Conjunctiva/sclera: Conjunctivae normal.  Cardiovascular:     Rate and Rhythm: Normal rate and regular rhythm.     Heart sounds: Normal heart sounds. No murmur heard.    No friction rub. No gallop.  Pulmonary:  Effort: Pulmonary effort is normal. No respiratory distress.     Breath sounds: Normal breath sounds.  Abdominal:     General: Bowel sounds are normal. There is no distension.     Palpations: Abdomen is soft. There is no mass.     Tenderness: There is no abdominal tenderness. There is no guarding.  Genitourinary:    Comments: Pelvic exam: VULVA: normal appearing vulva with no masses, tenderness or lesions, VAGINA: normal appearing vagina with normal color and discharge, no lesions, CERVIX: cervical discharge present - mucoid and yellow, Nabothian cyst at 2 o'clock, friable, Mirena strings visble but appear very long UTERUS: uterus is normal size, shape, consistency and nontender, ADNEXA: normal adnexa in size, nontender and no masses,  exam chaperoned by Jenna Toben  Musculoskeletal:     Cervical back: Normal range of motion.  Skin:    General: Skin is warm and dry.  Neurological:     Mental Status: She is alert and oriented to person, place, and time.  Psychiatric:        Behavior: Behavior normal.     (all labs ordered are listed, but only abnormal results are displayed) Labs Reviewed  WET PREP, GENITAL  URINALYSIS, ROUTINE W REFLEX MICROSCOPIC  PREGNANCY, URINE  SYPHILIS: RPR W/REFLEX TO RPR TITER AND TREPONEMAL ANTIBODIES, TRADITIONAL SCREENING AND DIAGNOSIS ALGORITHM  HIV ANTIBODY (ROUTINE TESTING W REFLEX)  GC/CHLAMYDIA PROBE AMP ([place]) NOT AT [facility]    EKG: None  Radiology: No results found.  {Document cardiac monitor, telemetry assessment procedure when appropriate:32947} Procedures   Medications Ordered in the ED - No data to display    {Click here for ABCD2, HEART and other calculators REFRESH Note before signing:1}                              Medical Decision Making Amount and/or Complexity of Data Reviewed Labs: ordered. Radiology: ordered.   ***  {Document critical care time when appropriate  Document review of labs and clinical decision tools ie CHADS2VASC2, etc  Document your independent review of radiology images and any outside records  Document your discussion with family members, caretakers and with consultants  Document social determinants of health affecting pt's care  Document your decision making why or why not admission, treatments were needed:32947:::1}   Final diagnoses:  None    ED Discharge Orders     None

## 2024-04-28 NOTE — Discharge Instructions (Signed)
 Begin taking Flagyl  as prescribed.  May take Diflucan  3 doses spread throughout taking the antibiotic.  Avoid douching or putting anything else in the vaginal area.  Please follow-up with gynecology in 1 to 2 weeks for reevaluation and for any continued symptoms.  Return to ED if any symptoms worsen including new fevers, severe abdominal/pelvic pain, severe vaginal pain/discharge.

## 2024-04-28 NOTE — ED Provider Notes (Signed)
 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  Briefly patient presents to the ED for pelvic pain.  Notes she did have a new sexual partner approximately 3 weeks ago and is unsure about STI.  States she has been having foul odor and some discomfort in the pelvis.  Also notes she feels like her IUD is out of place.  Denies fevers or vaginal bleeding.  BV was positive on wet prep.  Plan to wait on formal STI results.  Will pend ultrasound results in ED.  Physical Exam  BP 127/74   Pulse 75   Temp 98.9 F (37.2 C) (Oral)   Resp 18   Ht 5' 3 (1.6 m)   SpO2 100%   BMI 30.65 kg/m   Physical Exam Constitutional:      Appearance: Normal appearance.  HENT:     Head: Normocephalic and atraumatic.     Mouth/Throat:     Mouth: Mucous membranes are moist.     Pharynx: Oropharynx is clear.  Cardiovascular:     Rate and Rhythm: Normal rate.  Pulmonary:     Effort: Pulmonary effort is normal.  Skin:    General: Skin is warm and dry.  Neurological:     Mental Status: She is alert and oriented to person, place, and time.  Psychiatric:        Mood and Affect: Mood normal.        Behavior: Behavior normal.       ED Course / MDM   Clinical Course as of 04/28/24 2008  Sat Apr 28, 2024  1857 Clue Cells Wet Prep HPF POC(!): PRESENT BV  o wet prep  [AH]    Clinical Course User Index [AH] Arloa Chroman, PA-C   Medical Decision Making Patient is a 33 year old female who presented to the ED for pelvic discomfort and vaginal discharge/odor.  Patient received as a signout at shift change.  Labs reviewed that showed concerns for BV on wet prep.  Otherwise no acute abnormalities on urine and urine pregnancy negative.  Ultrasound of the pelvis reviewed that shows a low riding IUD but no other acute abnormalities.  Less concerns for PID or TOA today.  Patient otherwise well-appearing and comfortable.  Stable for discharge home.  Prescribed Flagyl  and Diflucan .  Symptomatic care  discussed.  Encourage follow-up with gynecologist in 1 to 2 weeks for reevaluation.  Also advised STI results will be online in approximately 48 hours.  Return precautions provided for worsening symptoms.  Amount and/or Complexity of Data Reviewed Labs: ordered. Decision-making details documented in ED Course. Radiology: ordered.  Risk Prescription drug management.          Neysa Thersia GORMAN DEVONNA 04/28/24 2008    Cottie Donnice PARAS, MD 04/28/24 2322

## 2024-04-28 NOTE — ED Notes (Signed)
 Pt acknowledged need for urine sample-cannot void at this time.

## 2024-04-28 NOTE — ED Triage Notes (Signed)
 Pt c/o vaginal pain and foul odor; thinks IUD has moved because she can feel it

## 2024-04-29 LAB — SYPHILIS: RPR W/REFLEX TO RPR TITER AND TREPONEMAL ANTIBODIES, TRADITIONAL SCREENING AND DIAGNOSIS ALGORITHM: RPR Ser Ql: NONREACTIVE

## 2024-04-30 LAB — GC/CHLAMYDIA PROBE AMP (~~LOC~~) NOT AT ARMC
Chlamydia: NEGATIVE
Comment: NEGATIVE
Comment: NORMAL
Neisseria Gonorrhea: NEGATIVE
